# Patient Record
Sex: Female | Born: 1955 | Race: White | Hispanic: No | Marital: Married | Smoking: Never smoker
Health system: Southern US, Community
[De-identification: ages and names within clinical notes are randomized; demographics above are authoritative.]

## PROBLEM LIST (undated history)

## (undated) DIAGNOSIS — E7800 Pure hypercholesterolemia, unspecified: Secondary | ICD-10-CM

## (undated) DIAGNOSIS — F419 Anxiety disorder, unspecified: Secondary | ICD-10-CM

## (undated) DIAGNOSIS — F329 Major depressive disorder, single episode, unspecified: Secondary | ICD-10-CM

## (undated) DIAGNOSIS — F32A Depression, unspecified: Secondary | ICD-10-CM

## (undated) DIAGNOSIS — I10 Essential (primary) hypertension: Secondary | ICD-10-CM

## (undated) DIAGNOSIS — E785 Hyperlipidemia, unspecified: Secondary | ICD-10-CM

## (undated) HISTORY — PX: COLONOSCOPY: SHX174

## (undated) HISTORY — PX: HERNIA REPAIR: SHX51

---

## 2010-05-17 ENCOUNTER — Other Ambulatory Visit: Admission: RE | Admit: 2010-05-17 | Discharge: 2010-05-17 | Payer: Self-pay | Admitting: Family Medicine

## 2011-06-29 ENCOUNTER — Other Ambulatory Visit: Payer: Self-pay | Admitting: Family Medicine

## 2011-06-29 DIAGNOSIS — Z1231 Encounter for screening mammogram for malignant neoplasm of breast: Secondary | ICD-10-CM

## 2011-07-25 ENCOUNTER — Ambulatory Visit
Admission: RE | Admit: 2011-07-25 | Discharge: 2011-07-25 | Disposition: A | Discharge status: Home / Self Care | Payer: BC Managed Care – HMO | Source: Ambulatory Visit | Attending: Family Medicine | Admitting: Family Medicine

## 2011-07-25 DIAGNOSIS — Z1231 Encounter for screening mammogram for malignant neoplasm of breast: Secondary | ICD-10-CM

## 2012-05-28 ENCOUNTER — Other Ambulatory Visit (HOSPITAL_COMMUNITY)
Admission: RE | Admit: 2012-05-28 | Discharge: 2012-05-28 | Disposition: A | Discharge status: Home / Self Care | Payer: BC Managed Care – HMO | Source: Ambulatory Visit | Attending: Family Medicine | Admitting: Family Medicine

## 2012-05-28 DIAGNOSIS — Z124 Encounter for screening for malignant neoplasm of cervix: Secondary | ICD-10-CM | POA: Insufficient documentation

## 2015-02-24 ENCOUNTER — Other Ambulatory Visit (HOSPITAL_COMMUNITY)
Admission: RE | Admit: 2015-02-24 | Discharge: 2015-02-24 | Disposition: A | Discharge status: Home / Self Care | Payer: 59 | Source: Ambulatory Visit | Attending: Family Medicine | Admitting: Family Medicine

## 2015-02-24 ENCOUNTER — Other Ambulatory Visit: Payer: Self-pay | Admitting: Family Medicine

## 2015-02-24 DIAGNOSIS — Z01419 Encounter for gynecological examination (general) (routine) without abnormal findings: Secondary | ICD-10-CM | POA: Insufficient documentation

## 2015-02-26 LAB — CYTOLOGY - PAP

## 2017-04-10 ENCOUNTER — Encounter (HOSPITAL_COMMUNITY): Payer: Self-pay | Admitting: Emergency Medicine

## 2017-04-10 ENCOUNTER — Ambulatory Visit (HOSPITAL_COMMUNITY)
Admission: EM | Admit: 2017-04-10 | Discharge: 2017-04-10 | Disposition: A | Discharge status: Home / Self Care | Payer: 59 | Attending: Family Medicine | Admitting: Family Medicine

## 2017-04-10 DIAGNOSIS — R1030 Lower abdominal pain, unspecified: Secondary | ICD-10-CM

## 2017-04-10 HISTORY — DX: Major depressive disorder, single episode, unspecified: F32.9

## 2017-04-10 HISTORY — DX: Essential (primary) hypertension: I10

## 2017-04-10 HISTORY — DX: Pure hypercholesterolemia, unspecified: E78.00

## 2017-04-10 HISTORY — DX: Depression, unspecified: F32.A

## 2017-04-10 LAB — POCT URINALYSIS DIP (DEVICE)
BILIRUBIN URINE: NEGATIVE
Glucose, UA: NEGATIVE mg/dL
Ketones, ur: 15 mg/dL — AB
NITRITE: NEGATIVE
PH: 6.5 (ref 5.0–8.0)
PROTEIN: NEGATIVE mg/dL
Specific Gravity, Urine: 1.01 (ref 1.005–1.030)
Urobilinogen, UA: 0.2 mg/dL (ref 0.0–1.0)

## 2017-04-10 NOTE — ED Triage Notes (Addendum)
Abdominal pain started yesterday morning.  Pain is in lower abdomen.  Pain is not going away, pain is worsening over time.  Denies urinary issues.  Last BM was this morning-considered normal.  Since then has had several loose stools, last one was approximately 4:00 pm

## 2017-04-10 NOTE — ED Provider Notes (Signed)
  [facility]   [id number] 04/10/17 Arrival Time: 1738  ASSESSMENT & PLAN:  1. Lower abdominal pain    Discussed possible causes of lower abdominal discomfort. U/A with small leukocytes. She is without specific urinary symptoms. No empiric treatment. Will send culture. She is comfortable with observation for the next 24 hours. Will proceed to the ED if any acute worsening.  Reviewed expectations re: course of current medical issues. Questions answered. Outlined signs and symptoms indicating need for more acute intervention. Patient verbalized understanding. After Visit Summary given.   SUBJECTIVE:  Sharon Beard is a 61 y.o. female who presents with complaint of lower abdominal discomfort first noticed yesterday morning. Generalized lower abdomen. Gradual onset but fairly persistent. Slightly worse with certain movements. No h/o similar. Normal appetite and PO intake. No n/v. Afebrile. No urinary symptoms. No flank or back discomfort. "Loose" bowel movement x 3 yesterday without blood. Passing gas. Ambulatory. Able to sleep through the night without discomfort.  H/O direct hernia repair L years ago. Has noticed "bulging" in the area. Has seen PCP regarding. Current symptoms not in same area.  Past Surgical History:  Procedure Laterality Date  . HERNIA REPAIR     Past Medical History:  Diagnosis Date  . Depression   . High cholesterol   . Hypertension    No FH of colon problems.  ROS: As per HPI. All other systems negative.   OBJECTIVE:  Vitals:   04/10/17 1808  BP: (!) 128/92  Pulse: 83  Resp: 18  Temp: 99.5 F (37.5 C)  TempSrc: Oral  SpO2: 97%     General appearance: alert; no distress HEENT: normocephalic; atraumatic Lungs: clear to auscultation bilaterally Abdomen: soft, generalized tenderness over lower abdomen; she questions R>L; does demonstrate rebound tenderness R>L; bowel sounds normal; no masses or organomegaly; no guarding Back: no CVA  tenderness Extremities: no cyanosis or edema; symmetrical with no gross deformities Skin: warm and dry Neurologic: normal gait Psychological:  alert and cooperative; normal mood and affect  Results for orders placed or performed during the hospital encounter of 04/10/17  POCT urinalysis dip (device)  Result Value Ref Range   Glucose, UA NEGATIVE NEGATIVE mg/dL   Bilirubin Urine NEGATIVE NEGATIVE   Ketones, ur 15 (A) NEGATIVE mg/dL   Specific Gravity, Urine 1.010 1.005 - 1.030   Hgb urine dipstick TRACE (A) NEGATIVE   pH 6.5 5.0 - 8.0   Protein, ur NEGATIVE NEGATIVE mg/dL   Urobilinogen, UA 0.2 0.0 - 1.0 mg/dL   Nitrite NEGATIVE NEGATIVE   Leukocytes, UA SMALL (A) NEGATIVE    Labs Reviewed  POCT URINALYSIS DIP (DEVICE) - Abnormal; Notable for the following:       Result Value   Ketones, ur 15 (*)    Hgb urine dipstick TRACE (*)    Leukocytes, UA SMALL (*)    All other components within normal limits    No Known Allergies     Mardella Layman, MD 04/10/17 [id number]

## 2017-12-11 ENCOUNTER — Other Ambulatory Visit: Payer: Self-pay | Admitting: Family Medicine

## 2017-12-11 ENCOUNTER — Other Ambulatory Visit (HOSPITAL_COMMUNITY)
Admission: RE | Admit: 2017-12-11 | Discharge: 2017-12-11 | Disposition: A | Discharge status: Home / Self Care | Payer: BLUE CROSS/BLUE SHIELD | Source: Ambulatory Visit | Attending: Family Medicine | Admitting: Family Medicine

## 2017-12-11 DIAGNOSIS — Z23 Encounter for immunization: Secondary | ICD-10-CM | POA: Diagnosis not present

## 2017-12-11 DIAGNOSIS — Z124 Encounter for screening for malignant neoplasm of cervix: Secondary | ICD-10-CM | POA: Diagnosis not present

## 2017-12-11 DIAGNOSIS — Z0000 Encounter for general adult medical examination without abnormal findings: Secondary | ICD-10-CM | POA: Diagnosis not present

## 2017-12-11 DIAGNOSIS — E7800 Pure hypercholesterolemia, unspecified: Secondary | ICD-10-CM | POA: Diagnosis not present

## 2017-12-11 DIAGNOSIS — Z1159 Encounter for screening for other viral diseases: Secondary | ICD-10-CM | POA: Diagnosis not present

## 2017-12-13 LAB — CYTOLOGY - PAP: DIAGNOSIS: NEGATIVE

## 2017-12-31 DIAGNOSIS — Z01818 Encounter for other preprocedural examination: Secondary | ICD-10-CM | POA: Diagnosis not present

## 2018-06-10 DIAGNOSIS — Z23 Encounter for immunization: Secondary | ICD-10-CM | POA: Diagnosis not present

## 2018-06-13 DIAGNOSIS — J309 Allergic rhinitis, unspecified: Secondary | ICD-10-CM | POA: Diagnosis not present

## 2018-06-13 DIAGNOSIS — Z1159 Encounter for screening for other viral diseases: Secondary | ICD-10-CM | POA: Diagnosis not present

## 2018-06-13 DIAGNOSIS — E7800 Pure hypercholesterolemia, unspecified: Secondary | ICD-10-CM | POA: Diagnosis not present

## 2018-06-13 DIAGNOSIS — F411 Generalized anxiety disorder: Secondary | ICD-10-CM | POA: Diagnosis not present

## 2018-06-13 DIAGNOSIS — I10 Essential (primary) hypertension: Secondary | ICD-10-CM | POA: Diagnosis not present

## 2019-01-03 DIAGNOSIS — Z0000 Encounter for general adult medical examination without abnormal findings: Secondary | ICD-10-CM | POA: Diagnosis not present

## 2019-01-07 DIAGNOSIS — E7800 Pure hypercholesterolemia, unspecified: Secondary | ICD-10-CM | POA: Diagnosis not present

## 2019-03-12 DIAGNOSIS — Z20828 Contact with and (suspected) exposure to other viral communicable diseases: Secondary | ICD-10-CM | POA: Diagnosis not present

## 2019-04-17 ENCOUNTER — Other Ambulatory Visit: Payer: Self-pay | Admitting: Family Medicine

## 2019-04-17 DIAGNOSIS — Z1231 Encounter for screening mammogram for malignant neoplasm of breast: Secondary | ICD-10-CM

## 2019-05-21 DIAGNOSIS — Z23 Encounter for immunization: Secondary | ICD-10-CM | POA: Diagnosis not present

## 2019-06-04 ENCOUNTER — Ambulatory Visit
Admission: RE | Admit: 2019-06-04 | Discharge: 2019-06-04 | Disposition: A | Discharge status: Home / Self Care | Payer: BLUE CROSS/BLUE SHIELD | Source: Ambulatory Visit | Attending: Family Medicine | Admitting: Family Medicine

## 2019-06-04 ENCOUNTER — Other Ambulatory Visit: Payer: Self-pay

## 2019-06-04 DIAGNOSIS — Z1231 Encounter for screening mammogram for malignant neoplasm of breast: Secondary | ICD-10-CM | POA: Diagnosis not present

## 2019-06-10 DIAGNOSIS — Z20828 Contact with and (suspected) exposure to other viral communicable diseases: Secondary | ICD-10-CM | POA: Diagnosis not present

## 2019-07-24 DIAGNOSIS — I10 Essential (primary) hypertension: Secondary | ICD-10-CM | POA: Diagnosis not present

## 2019-07-24 DIAGNOSIS — J309 Allergic rhinitis, unspecified: Secondary | ICD-10-CM | POA: Diagnosis not present

## 2019-07-24 DIAGNOSIS — F411 Generalized anxiety disorder: Secondary | ICD-10-CM | POA: Diagnosis not present

## 2019-07-24 DIAGNOSIS — E7800 Pure hypercholesterolemia, unspecified: Secondary | ICD-10-CM | POA: Diagnosis not present

## 2019-08-01 DIAGNOSIS — I10 Essential (primary) hypertension: Secondary | ICD-10-CM | POA: Diagnosis not present

## 2019-10-13 ENCOUNTER — Encounter (INDEPENDENT_AMBULATORY_CARE_PROVIDER_SITE_OTHER): Payer: Self-pay | Admitting: Otolaryngology

## 2019-10-13 ENCOUNTER — Other Ambulatory Visit: Payer: Self-pay

## 2019-10-13 ENCOUNTER — Ambulatory Visit (INDEPENDENT_AMBULATORY_CARE_PROVIDER_SITE_OTHER): Payer: BC Managed Care – PPO | Admitting: Otolaryngology

## 2019-10-13 VITALS — Temp 97.9°F

## 2019-10-13 DIAGNOSIS — H6123 Impacted cerumen, bilateral: Secondary | ICD-10-CM | POA: Diagnosis not present

## 2019-10-13 DIAGNOSIS — H903 Sensorineural hearing loss, bilateral: Secondary | ICD-10-CM | POA: Diagnosis not present

## 2019-10-13 NOTE — Progress Notes (Signed)
HPI: Sharon Beard is a 64 y.o. female who presents is referred by her audiologist for evaluation of cerumen buildup in both ear canals.  She just recently got hearing aids and was referred here to have her ears cleaned.. She has used Debrox at home.  Past Medical History:  Diagnosis Date  . Depression   . High cholesterol   . Hypertension    Past Surgical History:  Procedure Laterality Date  . HERNIA REPAIR     Social History   Socioeconomic History  . Marital status: Married    Spouse name: Not on file  . Number of children: Not on file  . Years of education: Not on file  . Highest education level: Not on file  Occupational History  . Not on file  Tobacco Use  . Smoking status: Never Smoker  . Smokeless tobacco: Never Used  Substance and Sexual Activity  . Alcohol use: Yes  . Drug use: No  . Sexual activity: Not on file  Other Topics Concern  . Not on file  Social History Narrative  . Not on file   Social Determinants of Health   Financial Resource Strain:   . Difficulty of Paying Living Expenses: Not on file  Food Insecurity:   . Worried About Programme researcher, broadcasting/film/video in the Last Year: Not on file  . Ran Out of Food in the Last Year: Not on file  Transportation Needs:   . Lack of Transportation (Medical): Not on file  . Lack of Transportation (Non-Medical): Not on file  Physical Activity:   . Days of Exercise per Week: Not on file  . Minutes of Exercise per Session: Not on file  Stress:   . Feeling of Stress : Not on file  Social Connections:   . Frequency of Communication with Friends and Family: Not on file  . Frequency of Social Gatherings with Friends and Family: Not on file  . Attends Religious Services: Not on file  . Active Member of Clubs or Organizations: Not on file  . Attends Banker Meetings: Not on file  . Marital Status: Not on file   Family History  Problem Relation Age of Onset  . Breast cancer Neg Hx    No Known  Allergies Prior to Admission medications   Medication Sig Start Date End Date Taking? Authorizing Provider  ENALAPRIL-HYDROCHLOROTHIAZIDE PO Take by mouth.   Yes [provider]  Multiple Vitamin (MULTIVITAMIN) tablet Take 1 tablet by mouth daily.   Yes [provider]  PAROXETINE HCL PO Take by mouth.   Yes [provider]  SIMVASTATIN PO Take by mouth.   Yes [provider]     Positive ROS: Otherwise negative  All other systems have been reviewed and were otherwise negative with the exception of those mentioned in the HPI and as above.  Physical Exam: Constitutional: Alert, well-appearing, no acute distress Ears: External ears without lesions or tenderness.  She had a large amount of wax in both ear canals saliva was adjacent to the TMs bilaterally.  This was cleaned with hydroperoxide and suction.  TMs were clear bilaterally. Nasal: External nose without lesions. Septum midline. Clear nasal passages Oral: Lips and gums without lesions. Tongue and palate mucosa without lesions. Posterior oropharynx clear. Neck: No palpable adenopathy or masses Respiratory: Breathing comfortably  Skin: No facial/neck lesions or rash noted.  Cerumen impaction removal  Date/Time: 10/13/2019 5:50 PM Performed by: Drema Halon, MD Authorized by: Drema Halon, MD  Consent:    Consent obtained:  Verbal   Consent given by:  Patient   Risks discussed:  Pain and bleeding Procedure details:    Location:  L ear and R ear   Procedure type: suction   Post-procedure details:    Inspection:  TM intact and canal normal   Hearing quality:  Improved   Patient tolerance of procedure:  Tolerated well, no immediate complications Comments:     TMs are clear bilaterally.    Assessment: Bilateral cerumen impactions. Bilateral sensorineural hearing loss.  Of note the right hearing aid is not working presently in the office  Plan: She will follow-up with  her audiologist and follow-up here as needed.   Radene Journey, MD   CC:

## 2019-10-26 ENCOUNTER — Ambulatory Visit: Payer: BC Managed Care – PPO | Attending: Internal Medicine

## 2019-10-26 DIAGNOSIS — Z23 Encounter for immunization: Secondary | ICD-10-CM | POA: Insufficient documentation

## 2019-10-26 NOTE — Progress Notes (Signed)
   Covid-19 Vaccination Clinic  Name:  Sharon Beard    MRN: [id number] DOB: [birth year]  10/26/2019  Sharon Beard was observed post Covid-19 immunization for 15 minutes without incident. She was provided with Vaccine Information Sheet and instruction to access the V-Safe system.   Sharon Beard was instructed to call 911 with any severe reactions post vaccine: Marland Kitchen Difficulty breathing  . Swelling of face and throat  . A fast heartbeat  . A bad rash all over body  . Dizziness and weakness   Immunizations Administered    Name Date Dose VIS Date Route   Pfizer COVID-19 Vaccine 10/26/2019  5:54 PM 0.3 mL 08/01/2019 Intramuscular   Manufacturer: [employer], [employer]   Lot: [id number]   NDC: [address]-2

## 2019-11-26 ENCOUNTER — Ambulatory Visit: Payer: BC Managed Care – PPO | Attending: Internal Medicine

## 2019-11-26 DIAGNOSIS — Z23 Encounter for immunization: Secondary | ICD-10-CM

## 2019-11-26 NOTE — Progress Notes (Signed)
   Covid-19 Vaccination Clinic  Name:  Takeisha Cianci    MRN: [id number] DOB: [birth year]  11/26/2019  Ms. Sherwood was observed post Covid-19 immunization for 15 minutes without incident. She was provided with Vaccine Information Sheet and instruction to access the V-Safe system.   Ms. Piechowski was instructed to call 911 with any severe reactions post vaccine: Marland Kitchen Difficulty breathing  . Swelling of face and throat  . A fast heartbeat  . A bad rash all over body  . Dizziness and weakness   Immunizations Administered    Name Date Dose VIS Date Route   Pfizer COVID-19 Vaccine 11/26/2019  9:24 AM 0.3 mL 08/01/2019 Intramuscular   Manufacturer: [employer], [employer]   Lot: [id number]   NDC: [address]-3

## 2020-04-13 DIAGNOSIS — F1010 Alcohol abuse, uncomplicated: Secondary | ICD-10-CM | POA: Diagnosis not present

## 2020-04-13 DIAGNOSIS — Z9189 Other specified personal risk factors, not elsewhere classified: Secondary | ICD-10-CM | POA: Diagnosis not present

## 2020-04-13 DIAGNOSIS — Z114 Encounter for screening for human immunodeficiency virus [HIV]: Secondary | ICD-10-CM | POA: Diagnosis not present

## 2020-04-13 DIAGNOSIS — Z1159 Encounter for screening for other viral diseases: Secondary | ICD-10-CM | POA: Diagnosis not present

## 2020-06-01 DIAGNOSIS — F419 Anxiety disorder, unspecified: Secondary | ICD-10-CM | POA: Diagnosis not present

## 2020-06-01 DIAGNOSIS — F1020 Alcohol dependence, uncomplicated: Secondary | ICD-10-CM | POA: Diagnosis not present

## 2020-06-10 DIAGNOSIS — Z23 Encounter for immunization: Secondary | ICD-10-CM | POA: Diagnosis not present

## 2020-06-15 DIAGNOSIS — F419 Anxiety disorder, unspecified: Secondary | ICD-10-CM | POA: Diagnosis not present

## 2020-06-15 DIAGNOSIS — F1020 Alcohol dependence, uncomplicated: Secondary | ICD-10-CM | POA: Diagnosis not present

## 2020-06-29 DIAGNOSIS — F1020 Alcohol dependence, uncomplicated: Secondary | ICD-10-CM | POA: Diagnosis not present

## 2020-06-29 DIAGNOSIS — I10 Essential (primary) hypertension: Secondary | ICD-10-CM | POA: Diagnosis not present

## 2020-07-27 DIAGNOSIS — F1020 Alcohol dependence, uncomplicated: Secondary | ICD-10-CM | POA: Diagnosis not present

## 2020-07-30 DIAGNOSIS — R059 Cough, unspecified: Secondary | ICD-10-CM | POA: Diagnosis not present

## 2020-07-30 DIAGNOSIS — J209 Acute bronchitis, unspecified: Secondary | ICD-10-CM | POA: Diagnosis not present

## 2020-09-30 DIAGNOSIS — I10 Essential (primary) hypertension: Secondary | ICD-10-CM | POA: Diagnosis not present

## 2020-09-30 DIAGNOSIS — J309 Allergic rhinitis, unspecified: Secondary | ICD-10-CM | POA: Diagnosis not present

## 2020-09-30 DIAGNOSIS — E7800 Pure hypercholesterolemia, unspecified: Secondary | ICD-10-CM | POA: Diagnosis not present

## 2020-09-30 DIAGNOSIS — F411 Generalized anxiety disorder: Secondary | ICD-10-CM | POA: Diagnosis not present

## 2020-09-30 DIAGNOSIS — Z23 Encounter for immunization: Secondary | ICD-10-CM | POA: Diagnosis not present

## 2020-09-30 DIAGNOSIS — Z1211 Encounter for screening for malignant neoplasm of colon: Secondary | ICD-10-CM | POA: Diagnosis not present

## 2020-11-29 DIAGNOSIS — F410 Panic disorder [episodic paroxysmal anxiety] without agoraphobia: Secondary | ICD-10-CM | POA: Diagnosis not present

## 2020-11-29 DIAGNOSIS — F1020 Alcohol dependence, uncomplicated: Secondary | ICD-10-CM | POA: Diagnosis not present

## 2020-12-13 DIAGNOSIS — F409 Phobic anxiety disorder, unspecified: Secondary | ICD-10-CM | POA: Diagnosis not present

## 2020-12-13 DIAGNOSIS — F1020 Alcohol dependence, uncomplicated: Secondary | ICD-10-CM | POA: Diagnosis not present

## 2020-12-14 IMAGING — MG DIGITAL SCREENING BILAT W/ TOMO W/ CAD
6 of 10 series · 6 of 30 positions shown · non-contrast
Comparison: Previous exam(s).

CLINICAL DATA: Screening.

EXAM:
DIGITAL SCREENING BILATERAL MAMMOGRAM WITH TOMO AND CAD

[L CC synth-2D (1 of 2)]
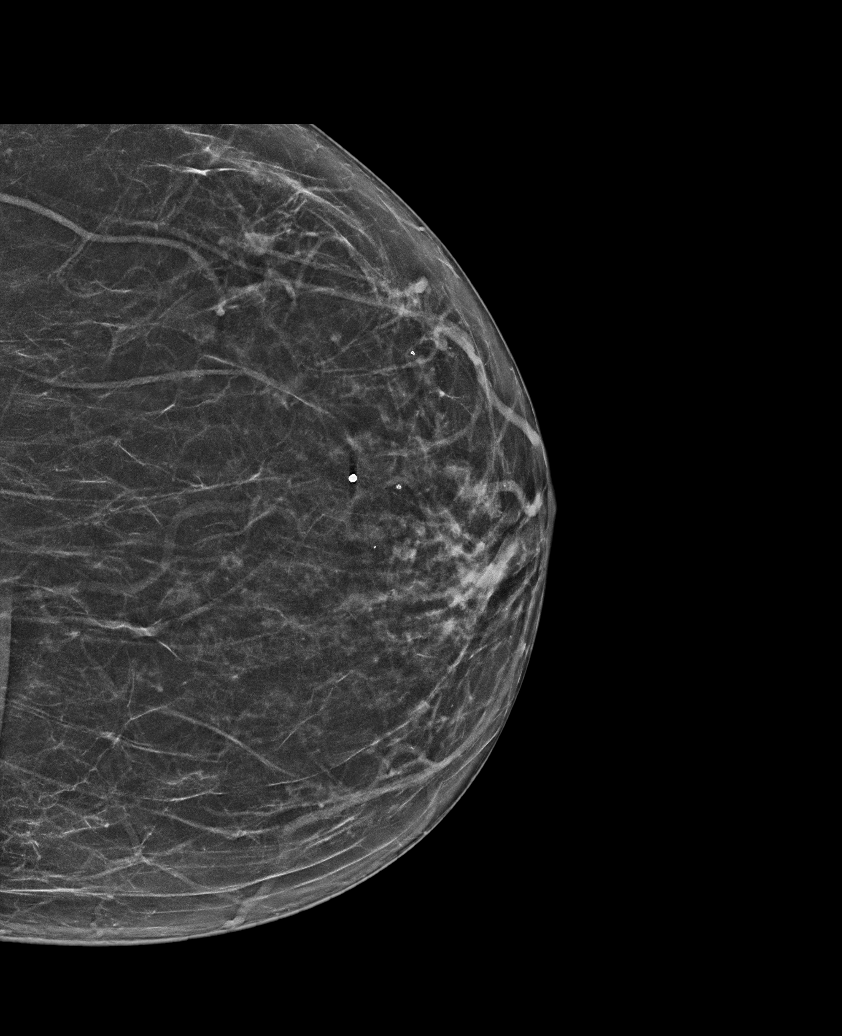

[L MLO synth-2D]
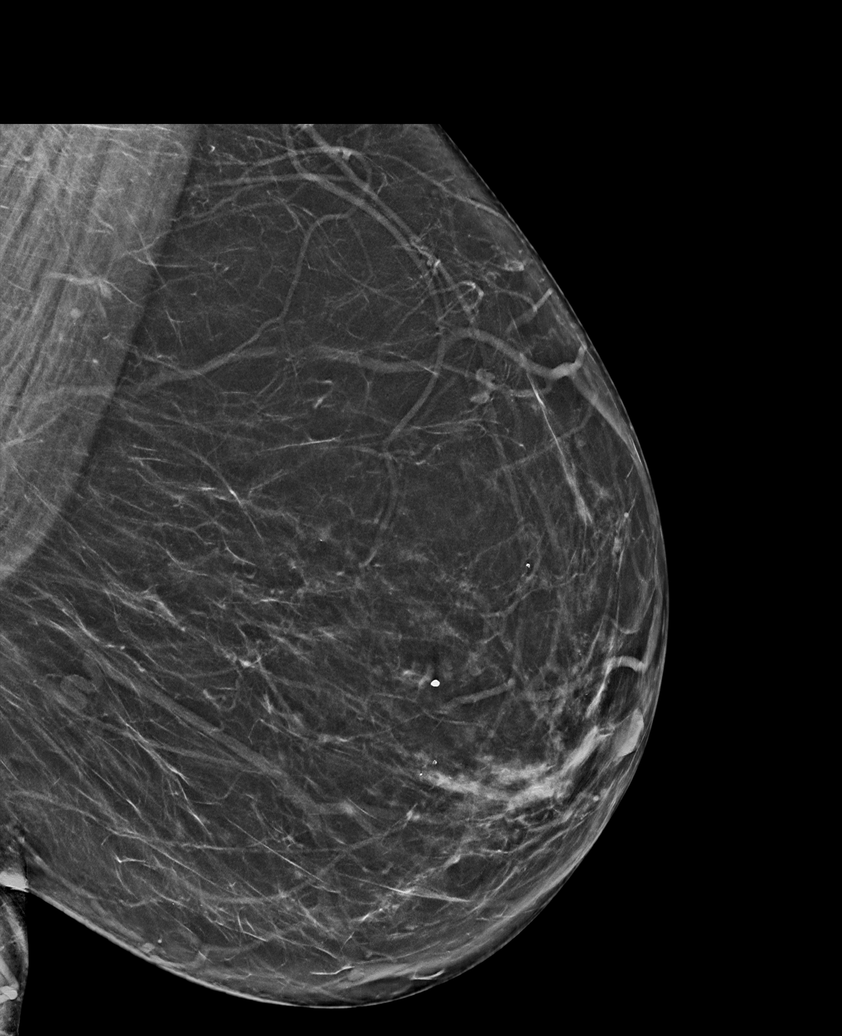

[L CC synth-2D (2 of 2)]
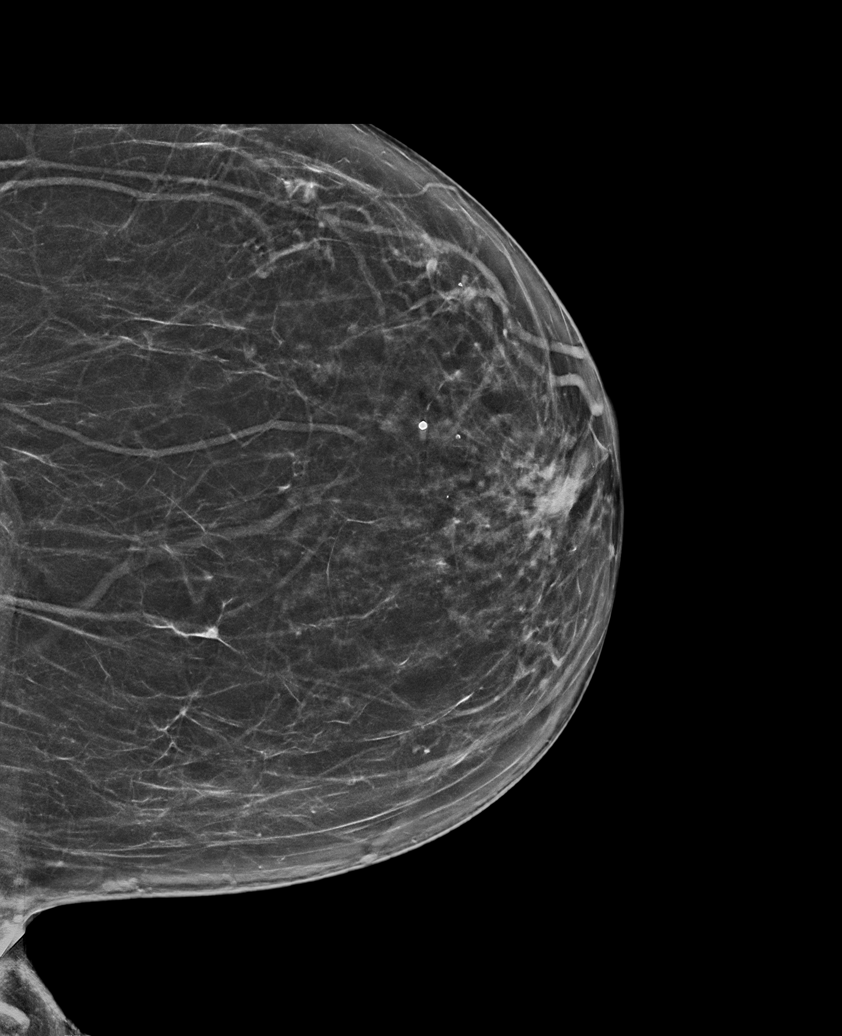

[R CC synth-2D]
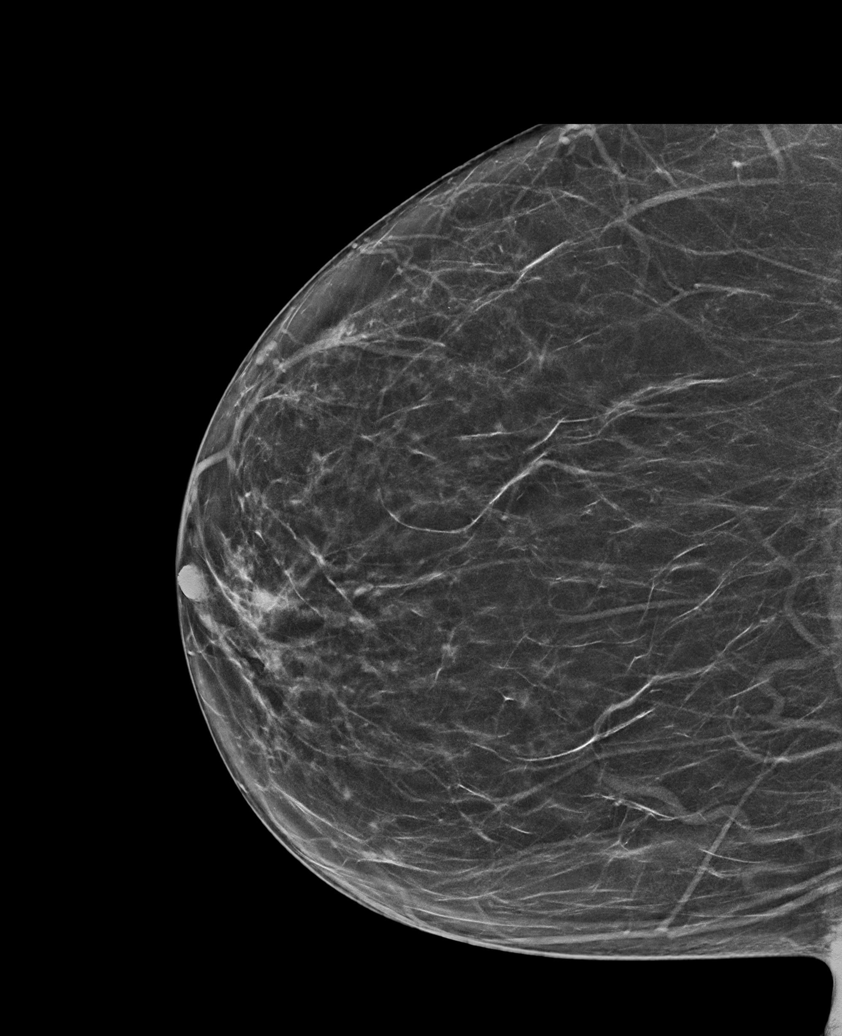

[R MLO synth-2D]
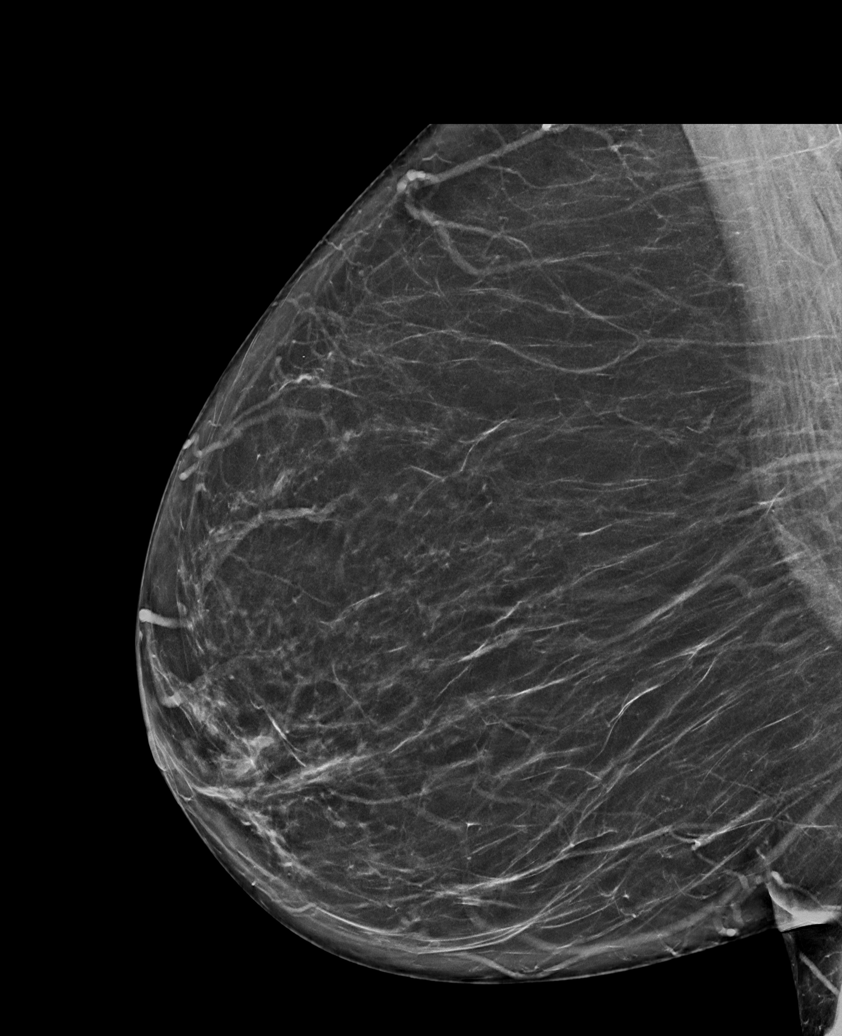

[R MLO tomo · tomo slice 41/80.0]
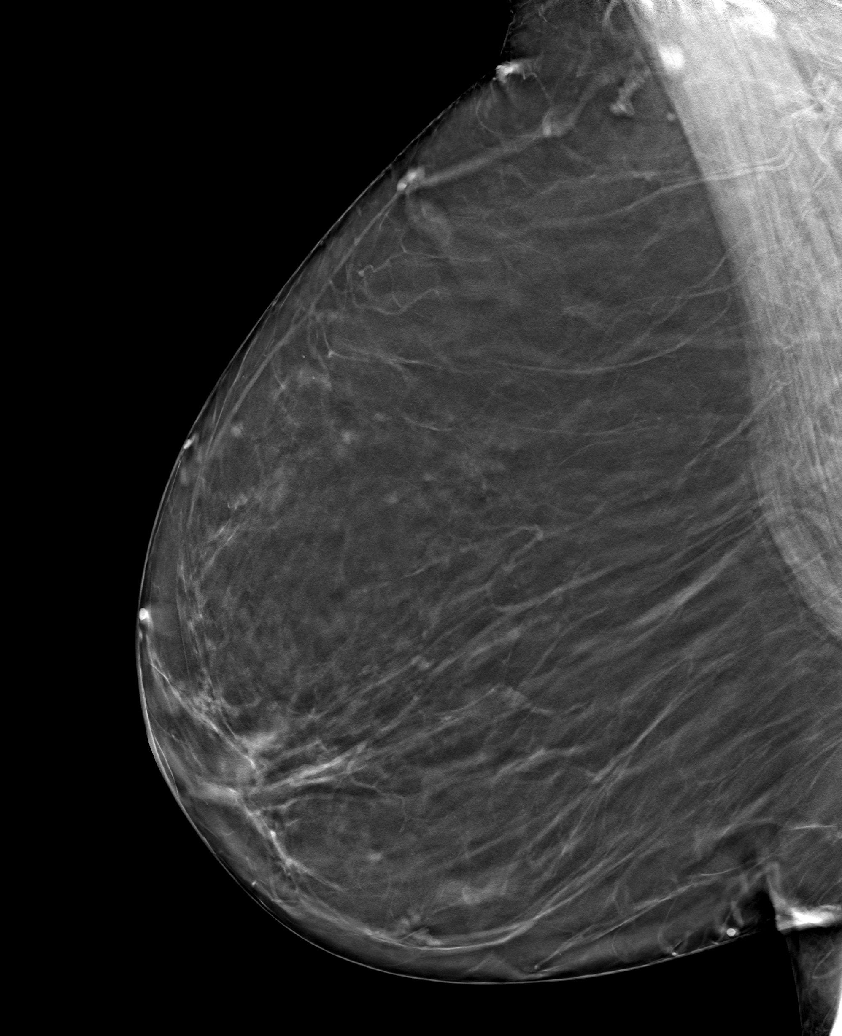

[6 of 30 positions shown; findings below may reference images not displayed]

ACR Breast Density Category b: There are scattered areas of
fibroglandular density.
FINDINGS: There are no findings suspicious for malignancy. Images were
processed with CAD.
IMPRESSION: No mammographic evidence of malignancy. A result letter of this
screening mammogram will be mailed directly to the patient.

RECOMMENDATION:
Screening mammogram in one year. (Code:[id number])

BI-RADS CATEGORY  1: Negative.

## 2020-12-27 DIAGNOSIS — F409 Phobic anxiety disorder, unspecified: Secondary | ICD-10-CM | POA: Diagnosis not present

## 2020-12-27 DIAGNOSIS — F1020 Alcohol dependence, uncomplicated: Secondary | ICD-10-CM | POA: Diagnosis not present

## 2021-01-26 DIAGNOSIS — F1020 Alcohol dependence, uncomplicated: Secondary | ICD-10-CM | POA: Diagnosis not present

## 2021-01-26 DIAGNOSIS — F410 Panic disorder [episodic paroxysmal anxiety] without agoraphobia: Secondary | ICD-10-CM | POA: Diagnosis not present

## 2021-03-02 DIAGNOSIS — F409 Phobic anxiety disorder, unspecified: Secondary | ICD-10-CM | POA: Diagnosis not present

## 2021-03-02 DIAGNOSIS — F1020 Alcohol dependence, uncomplicated: Secondary | ICD-10-CM | POA: Diagnosis not present

## 2021-03-25 DIAGNOSIS — E7800 Pure hypercholesterolemia, unspecified: Secondary | ICD-10-CM | POA: Diagnosis not present

## 2021-03-25 DIAGNOSIS — Z0000 Encounter for general adult medical examination without abnormal findings: Secondary | ICD-10-CM | POA: Diagnosis not present

## 2021-03-25 DIAGNOSIS — F411 Generalized anxiety disorder: Secondary | ICD-10-CM | POA: Diagnosis not present

## 2021-03-25 DIAGNOSIS — M25559 Pain in unspecified hip: Secondary | ICD-10-CM | POA: Diagnosis not present

## 2021-03-25 DIAGNOSIS — Z124 Encounter for screening for malignant neoplasm of cervix: Secondary | ICD-10-CM | POA: Diagnosis not present

## 2021-03-25 DIAGNOSIS — I10 Essential (primary) hypertension: Secondary | ICD-10-CM | POA: Diagnosis not present

## 2021-03-25 DIAGNOSIS — Z23 Encounter for immunization: Secondary | ICD-10-CM | POA: Diagnosis not present

## 2021-03-30 ENCOUNTER — Other Ambulatory Visit: Payer: Self-pay | Admitting: Family Medicine

## 2021-03-30 DIAGNOSIS — E2839 Other primary ovarian failure: Secondary | ICD-10-CM

## 2021-04-04 DIAGNOSIS — F1020 Alcohol dependence, uncomplicated: Secondary | ICD-10-CM | POA: Diagnosis not present

## 2021-04-04 DIAGNOSIS — F409 Phobic anxiety disorder, unspecified: Secondary | ICD-10-CM | POA: Diagnosis not present

## 2021-04-05 ENCOUNTER — Other Ambulatory Visit: Payer: Self-pay | Admitting: Family Medicine

## 2021-04-05 DIAGNOSIS — Z1231 Encounter for screening mammogram for malignant neoplasm of breast: Secondary | ICD-10-CM

## 2021-04-14 ENCOUNTER — Ambulatory Visit
Admission: RE | Admit: 2021-04-14 | Discharge: 2021-04-14 | Disposition: A | Discharge status: Home / Self Care | Payer: BC Managed Care – PPO | Source: Ambulatory Visit | Attending: Family Medicine | Admitting: Family Medicine

## 2021-04-14 ENCOUNTER — Other Ambulatory Visit: Payer: Self-pay

## 2021-04-14 DIAGNOSIS — Z1231 Encounter for screening mammogram for malignant neoplasm of breast: Secondary | ICD-10-CM | POA: Diagnosis not present

## 2021-05-09 DIAGNOSIS — F1020 Alcohol dependence, uncomplicated: Secondary | ICD-10-CM | POA: Diagnosis not present

## 2021-05-09 DIAGNOSIS — F409 Phobic anxiety disorder, unspecified: Secondary | ICD-10-CM | POA: Diagnosis not present

## 2021-06-01 DIAGNOSIS — F1020 Alcohol dependence, uncomplicated: Secondary | ICD-10-CM | POA: Diagnosis not present

## 2021-06-06 DIAGNOSIS — F1020 Alcohol dependence, uncomplicated: Secondary | ICD-10-CM | POA: Diagnosis not present

## 2021-06-06 DIAGNOSIS — F409 Phobic anxiety disorder, unspecified: Secondary | ICD-10-CM | POA: Diagnosis not present

## 2021-06-30 DIAGNOSIS — F1020 Alcohol dependence, uncomplicated: Secondary | ICD-10-CM | POA: Diagnosis not present

## 2021-07-07 DIAGNOSIS — F1020 Alcohol dependence, uncomplicated: Secondary | ICD-10-CM | POA: Diagnosis not present

## 2021-07-07 DIAGNOSIS — F409 Phobic anxiety disorder, unspecified: Secondary | ICD-10-CM | POA: Diagnosis not present

## 2021-07-26 DIAGNOSIS — F1020 Alcohol dependence, uncomplicated: Secondary | ICD-10-CM | POA: Diagnosis not present

## 2021-08-24 DIAGNOSIS — F409 Phobic anxiety disorder, unspecified: Secondary | ICD-10-CM | POA: Diagnosis not present

## 2021-08-24 DIAGNOSIS — F1020 Alcohol dependence, uncomplicated: Secondary | ICD-10-CM | POA: Diagnosis not present

## 2021-09-20 DIAGNOSIS — M25511 Pain in right shoulder: Secondary | ICD-10-CM | POA: Diagnosis not present

## 2021-09-20 DIAGNOSIS — G5600 Carpal tunnel syndrome, unspecified upper limb: Secondary | ICD-10-CM | POA: Diagnosis not present

## 2021-09-23 ENCOUNTER — Ambulatory Visit
Admission: RE | Admit: 2021-09-23 | Discharge: 2021-09-23 | Disposition: A | Discharge status: Home / Self Care | Payer: BC Managed Care – PPO | Source: Ambulatory Visit | Attending: Family Medicine | Admitting: Family Medicine

## 2021-09-23 DIAGNOSIS — M8589 Other specified disorders of bone density and structure, multiple sites: Secondary | ICD-10-CM | POA: Diagnosis not present

## 2021-09-23 DIAGNOSIS — Z780 Asymptomatic menopausal state: Secondary | ICD-10-CM | POA: Diagnosis not present

## 2021-09-23 DIAGNOSIS — E2839 Other primary ovarian failure: Secondary | ICD-10-CM

## 2021-09-28 DIAGNOSIS — F411 Generalized anxiety disorder: Secondary | ICD-10-CM | POA: Diagnosis not present

## 2021-09-28 DIAGNOSIS — Z23 Encounter for immunization: Secondary | ICD-10-CM | POA: Diagnosis not present

## 2021-09-28 DIAGNOSIS — E7800 Pure hypercholesterolemia, unspecified: Secondary | ICD-10-CM | POA: Diagnosis not present

## 2021-09-28 DIAGNOSIS — J309 Allergic rhinitis, unspecified: Secondary | ICD-10-CM | POA: Diagnosis not present

## 2021-09-28 DIAGNOSIS — I10 Essential (primary) hypertension: Secondary | ICD-10-CM | POA: Diagnosis not present

## 2021-11-04 DIAGNOSIS — R200 Anesthesia of skin: Secondary | ICD-10-CM | POA: Diagnosis not present

## 2021-11-04 DIAGNOSIS — M1812 Unilateral primary osteoarthritis of first carpometacarpal joint, left hand: Secondary | ICD-10-CM | POA: Diagnosis not present

## 2021-11-04 DIAGNOSIS — R202 Paresthesia of skin: Secondary | ICD-10-CM | POA: Diagnosis not present

## 2021-11-08 DIAGNOSIS — R202 Paresthesia of skin: Secondary | ICD-10-CM | POA: Diagnosis not present

## 2021-11-08 DIAGNOSIS — R200 Anesthesia of skin: Secondary | ICD-10-CM | POA: Diagnosis not present

## 2021-11-30 DIAGNOSIS — G5612 Other lesions of median nerve, left upper limb: Secondary | ICD-10-CM | POA: Diagnosis not present

## 2021-12-05 ENCOUNTER — Other Ambulatory Visit: Payer: Self-pay | Admitting: Orthopedic Surgery

## 2021-12-05 DIAGNOSIS — G5602 Carpal tunnel syndrome, left upper limb: Secondary | ICD-10-CM | POA: Diagnosis not present

## 2021-12-06 ENCOUNTER — Encounter (HOSPITAL_BASED_OUTPATIENT_CLINIC_OR_DEPARTMENT_OTHER): Payer: Self-pay | Admitting: Orthopedic Surgery

## 2021-12-06 ENCOUNTER — Other Ambulatory Visit: Payer: Self-pay

## 2021-12-13 ENCOUNTER — Encounter (HOSPITAL_BASED_OUTPATIENT_CLINIC_OR_DEPARTMENT_OTHER)
Admission: RE | Admit: 2021-12-13 | Discharge: 2021-12-13 | Disposition: A | Discharge status: Home / Self Care | Payer: BC Managed Care – PPO | Source: Ambulatory Visit | Attending: Orthopedic Surgery | Admitting: Orthopedic Surgery

## 2021-12-13 DIAGNOSIS — I10 Essential (primary) hypertension: Secondary | ICD-10-CM | POA: Insufficient documentation

## 2021-12-13 DIAGNOSIS — Z79899 Other long term (current) drug therapy: Secondary | ICD-10-CM | POA: Diagnosis not present

## 2021-12-13 DIAGNOSIS — F32A Depression, unspecified: Secondary | ICD-10-CM | POA: Diagnosis not present

## 2021-12-13 DIAGNOSIS — G5602 Carpal tunnel syndrome, left upper limb: Secondary | ICD-10-CM | POA: Diagnosis not present

## 2021-12-13 DIAGNOSIS — M65832 Other synovitis and tenosynovitis, left forearm: Secondary | ICD-10-CM | POA: Diagnosis not present

## 2021-12-13 DIAGNOSIS — F419 Anxiety disorder, unspecified: Secondary | ICD-10-CM | POA: Diagnosis not present

## 2021-12-13 DIAGNOSIS — Z01818 Encounter for other preprocedural examination: Secondary | ICD-10-CM | POA: Insufficient documentation

## 2021-12-13 LAB — BASIC METABOLIC PANEL
Anion gap: 8 (ref 5–15)
BUN: 16 mg/dL (ref 8–23)
CO2: 25 mmol/L (ref 22–32)
Calcium: 9.3 mg/dL (ref 8.9–10.3)
Chloride: 106 mmol/L (ref 98–111)
Creatinine, Ser: 0.82 mg/dL (ref 0.44–1.00)
GFR, Estimated: >60 mL/min (ref >60)
Glucose, Bld: 87 mg/dL (ref 70–99)
Potassium: 5.1 mmol/L (ref 3.5–5.1)
Sodium: 139 mmol/L (ref 135–145)

## 2021-12-13 NOTE — Progress Notes (Signed)

## 2021-12-16 ENCOUNTER — Encounter (HOSPITAL_BASED_OUTPATIENT_CLINIC_OR_DEPARTMENT_OTHER)
Admission: RE | Disposition: A | Discharge status: Home / Self Care | Payer: Self-pay | Source: Home / Self Care | Attending: Orthopedic Surgery

## 2021-12-16 ENCOUNTER — Ambulatory Visit (HOSPITAL_BASED_OUTPATIENT_CLINIC_OR_DEPARTMENT_OTHER): Payer: BC Managed Care – PPO | Admitting: Anesthesiology

## 2021-12-16 ENCOUNTER — Ambulatory Visit (HOSPITAL_BASED_OUTPATIENT_CLINIC_OR_DEPARTMENT_OTHER)
Admission: RE | Admit: 2021-12-16 | Discharge: 2021-12-16 | Disposition: A | Discharge status: Home / Self Care | Payer: BC Managed Care – PPO | Attending: Orthopedic Surgery | Admitting: Orthopedic Surgery

## 2021-12-16 ENCOUNTER — Encounter (HOSPITAL_BASED_OUTPATIENT_CLINIC_OR_DEPARTMENT_OTHER): Payer: Self-pay | Admitting: Orthopedic Surgery

## 2021-12-16 ENCOUNTER — Other Ambulatory Visit: Payer: Self-pay

## 2021-12-16 DIAGNOSIS — F419 Anxiety disorder, unspecified: Secondary | ICD-10-CM | POA: Diagnosis not present

## 2021-12-16 DIAGNOSIS — I10 Essential (primary) hypertension: Secondary | ICD-10-CM | POA: Insufficient documentation

## 2021-12-16 DIAGNOSIS — M67832 Other specified disorders of synovium, left wrist: Secondary | ICD-10-CM | POA: Diagnosis not present

## 2021-12-16 DIAGNOSIS — M6580 Other synovitis and tenosynovitis, unspecified site: Secondary | ICD-10-CM | POA: Diagnosis not present

## 2021-12-16 DIAGNOSIS — Z79899 Other long term (current) drug therapy: Secondary | ICD-10-CM | POA: Diagnosis not present

## 2021-12-16 DIAGNOSIS — M65832 Other synovitis and tenosynovitis, left forearm: Secondary | ICD-10-CM | POA: Diagnosis not present

## 2021-12-16 DIAGNOSIS — G5602 Carpal tunnel syndrome, left upper limb: Secondary | ICD-10-CM | POA: Diagnosis not present

## 2021-12-16 DIAGNOSIS — F32A Depression, unspecified: Secondary | ICD-10-CM | POA: Diagnosis not present

## 2021-12-16 DIAGNOSIS — M659 Synovitis and tenosynovitis, unspecified: Secondary | ICD-10-CM | POA: Diagnosis not present

## 2021-12-16 HISTORY — PX: CARPAL TUNNEL RELEASE: SHX101

## 2021-12-16 HISTORY — DX: Anxiety disorder, unspecified: F41.9

## 2021-12-16 HISTORY — DX: Hyperlipidemia, unspecified: E78.5

## 2021-12-16 HISTORY — PX: TENOSYNOVECTOMY: SHX6110

## 2021-12-16 SURGERY — CARPAL TUNNEL RELEASE
Anesthesia: Regional | Site: Wrist | Laterality: Left

## 2021-12-16 MED ORDER — PROPOFOL 10 MG/ML IV BOLUS
INTRAVENOUS | Status: AC
  Filled 2021-12-16: qty 20

## 2021-12-16 MED ORDER — MIDAZOLAM HCL 2 MG/2ML IJ SOLN
INTRAMUSCULAR | Status: AC
  Filled 2021-12-16: qty 2

## 2021-12-16 MED ORDER — LACTATED RINGERS IV SOLN: INTRAVENOUS | Status: DC

## 2021-12-16 MED ORDER — LIDOCAINE 2% (20 MG/ML) 5 ML SYRINGE
INTRAMUSCULAR | Status: AC
  Filled 2021-12-16: qty 5

## 2021-12-16 MED ORDER — LIDOCAINE 2% (20 MG/ML) 5 ML SYRINGE
INTRAMUSCULAR | Status: DC | PRN
  Administered 2021-12-16: 20 mg via INTRAVENOUS

## 2021-12-16 MED ORDER — OXYCODONE HCL 5 MG PO TABS: 5.0000 mg | ORAL_TABLET | Freq: Once | ORAL | Status: DC | PRN

## 2021-12-16 MED ORDER — MIDAZOLAM HCL 5 MG/5ML IJ SOLN
INTRAMUSCULAR | Status: DC | PRN
  Administered 2021-12-16: 2 mg via INTRAVENOUS

## 2021-12-16 MED ORDER — CEFAZOLIN SODIUM-DEXTROSE 2-4 GM/100ML-% IV SOLN
INTRAVENOUS | Status: AC
  Filled 2021-12-16: qty 100

## 2021-12-16 MED ORDER — ACETAMINOPHEN 10 MG/ML IV SOLN
1000.0000 mg | Freq: Once | INTRAVENOUS | Status: DC | PRN
  Administered 2021-12-16: 1000 mg via INTRAVENOUS

## 2021-12-16 MED ORDER — ACETAMINOPHEN 160 MG/5ML PO SOLN: 325.0000 mg | ORAL | Status: DC | PRN

## 2021-12-16 MED ORDER — OXYCODONE HCL 5 MG/5ML PO SOLN: 5.0000 mg | Freq: Once | ORAL | Status: DC | PRN

## 2021-12-16 MED ORDER — ACETAMINOPHEN 325 MG PO TABS: 325.0000 mg | ORAL_TABLET | ORAL | Status: DC | PRN

## 2021-12-16 MED ORDER — ACETAMINOPHEN 10 MG/ML IV SOLN
INTRAVENOUS | Status: AC
  Filled 2021-12-16: qty 100

## 2021-12-16 MED ORDER — PROPOFOL 500 MG/50ML IV EMUL
INTRAVENOUS | Status: DC | PRN
  Administered 2021-12-16: 200 ug/kg/min via INTRAVENOUS

## 2021-12-16 MED ORDER — ONDANSETRON HCL 4 MG/2ML IJ SOLN
INTRAMUSCULAR | Status: DC | PRN
  Administered 2021-12-16: 4 mg via INTRAVENOUS

## 2021-12-16 MED ORDER — CEFAZOLIN SODIUM-DEXTROSE 2-4 GM/100ML-% IV SOLN
2.0000 g | INTRAVENOUS | Status: AC
  Administered 2021-12-16: 2 g via INTRAVENOUS

## 2021-12-16 MED ORDER — FENTANYL CITRATE (PF) 100 MCG/2ML IJ SOLN
INTRAMUSCULAR | Status: DC | PRN
  Administered 2021-12-16 (×2): 25 ug via INTRAVENOUS
  Administered 2021-12-16: 50 ug via INTRAVENOUS

## 2021-12-16 MED ORDER — HYDROCODONE-ACETAMINOPHEN 5-325 MG PO TABS: ORAL_TABLET | ORAL | 0 refills | Status: DC

## 2021-12-16 MED ORDER — LIDOCAINE HCL (PF) 0.5 % IJ SOLN
INTRAMUSCULAR | Status: DC | PRN
  Administered 2021-12-16: 30 mL via INTRAVENOUS

## 2021-12-16 MED ORDER — BUPIVACAINE HCL (PF) 0.25 % IJ SOLN
INTRAMUSCULAR | Status: DC | PRN
  Administered 2021-12-16: 9 mL

## 2021-12-16 MED ORDER — FENTANYL CITRATE (PF) 100 MCG/2ML IJ SOLN
INTRAMUSCULAR | Status: AC
  Filled 2021-12-16: qty 2

## 2021-12-16 MED ORDER — FENTANYL CITRATE (PF) 100 MCG/2ML IJ SOLN: 25.0000 ug | INTRAMUSCULAR | Status: DC | PRN

## 2021-12-16 MED ORDER — 0.9 % SODIUM CHLORIDE (POUR BTL) OPTIME
TOPICAL | Status: DC | PRN
  Administered 2021-12-16: 120 mL

## 2021-12-16 SURGICAL SUPPLY — 40 items
APL PRP STRL LF DISP 70% ISPRP (MISCELLANEOUS) ×2
BLADE SURG 15 STRL LF DISP TIS (BLADE) ×4 IMPLANT
BLADE SURG 15 STRL SS (BLADE) ×6
BNDG CMPR 9X4 STRL LF SNTH (GAUZE/BANDAGES/DRESSINGS) ×2
BNDG ELASTIC 3X5.8 VLCR STR LF (GAUZE/BANDAGES/DRESSINGS) ×3 IMPLANT
BNDG ESMARK 4X9 LF (GAUZE/BANDAGES/DRESSINGS) ×1 IMPLANT
BNDG GAUZE ELAST 4 BULKY (GAUZE/BANDAGES/DRESSINGS) ×3 IMPLANT
CHLORAPREP W/TINT 26 (MISCELLANEOUS) ×3 IMPLANT
CORD BIPOLAR FORCEPS 12FT (ELECTRODE) ×3 IMPLANT
COVER BACK TABLE 60X90IN (DRAPES) ×3 IMPLANT
COVER MAYO STAND STRL (DRAPES) ×3 IMPLANT
CUFF TOURN SGL QUICK 18X4 (TOURNIQUET CUFF) ×2 IMPLANT
DRAPE EXTREMITY T 121X128X90 (DISPOSABLE) ×3 IMPLANT
DRAPE SURG 17X23 STRL (DRAPES) ×2 IMPLANT
DRAPE U-SHAPE 47X51 STRL (DRAPES) ×1 IMPLANT
DRSG PAD ABDOMINAL 8X10 ST (GAUZE/BANDAGES/DRESSINGS) ×3 IMPLANT
GAUZE 4X4 16PLY ~~LOC~~+RFID DBL (SPONGE) ×1 IMPLANT
GAUZE SPONGE 4X4 12PLY STRL (GAUZE/BANDAGES/DRESSINGS) ×3 IMPLANT
GAUZE XEROFORM 1X8 LF (GAUZE/BANDAGES/DRESSINGS) ×3 IMPLANT
GLOVE BIO SURGEON STRL SZ7.5 (GLOVE) ×3 IMPLANT
GLOVE BIOGEL PI IND STRL 7.0 (GLOVE) IMPLANT
GLOVE BIOGEL PI IND STRL 8 (GLOVE) ×2 IMPLANT
GLOVE BIOGEL PI INDICATOR 7.0 (GLOVE) ×2
GLOVE BIOGEL PI INDICATOR 8 (GLOVE) ×1
GLOVE SURG SS PI 7.0 STRL IVOR (GLOVE) ×1 IMPLANT
GOWN STRL REUS W/ TWL LRG LVL3 (GOWN DISPOSABLE) ×2 IMPLANT
GOWN STRL REUS W/TWL LRG LVL3 (GOWN DISPOSABLE) ×3
GOWN STRL REUS W/TWL XL LVL3 (GOWN DISPOSABLE) ×3 IMPLANT
NDL HYPO 25X1 1.5 SAFETY (NEEDLE) ×2 IMPLANT
NEEDLE HYPO 25X1 1.5 SAFETY (NEEDLE) ×3 IMPLANT
NS IRRIG 1000ML POUR BTL (IV SOLUTION) ×3 IMPLANT
PACK BASIN DAY SURGERY FS (CUSTOM PROCEDURE TRAY) ×3 IMPLANT
PADDING CAST ABS 4INX4YD NS (CAST SUPPLIES)
PADDING CAST ABS COTTON 4X4 ST (CAST SUPPLIES) ×2 IMPLANT
STOCKINETTE 4X48 STRL (DRAPES) ×3 IMPLANT
SUT ETHILON 4 0 PS 2 18 (SUTURE) ×3 IMPLANT
SYR BULB EAR ULCER 3OZ GRN STR (SYRINGE) ×3 IMPLANT
SYR CONTROL 10ML LL (SYRINGE) ×3 IMPLANT
TOWEL GREEN STERILE FF (TOWEL DISPOSABLE) ×6 IMPLANT
UNDERPAD 30X36 HEAVY ABSORB (UNDERPADS AND DIAPERS) ×3 IMPLANT

## 2021-12-16 NOTE — H&P (Signed)
?  Sharon Beard is an 66 y.o. female.   ?Chief Complaint: carpal tunnel syndrome ?HPI: 66 yo female with numbness and tingling left hand.  This is bothersome to her.  Positive nerve conduction studies.  She wishes to have left carpal tunnel release. ? ?Allergies: No Known Allergies ? ?Past Medical History:  ?Diagnosis Date  ? Anxiety   ? Depression   ? High cholesterol   ? Hyperlipidemia   ? Hypertension   ? ? ?Past Surgical History:  ?Procedure Laterality Date  ? COLONOSCOPY    ? HERNIA REPAIR    ? ? ?Family History: ?Family History  ?Problem Relation Age of Onset  ? Breast cancer Neg Hx   ? ? ?Social History:  ? reports that she has never smoked. She has never used smokeless tobacco. She reports current alcohol use. She reports that she does not use drugs. ? ?Medications: ?Medications Prior to Admission  ?Medication Sig Dispense Refill  ? atorvastatin (LIPITOR) 40 MG tablet Take 40 mg by mouth daily.    ? ENALAPRIL-HYDROCHLOROTHIAZIDE PO Take 10-25 mg by mouth daily.    ? meloxicam (MOBIC) 7.5 MG tablet Take 7.5 mg by mouth daily.    ? Multiple Vitamin (MULTIVITAMIN) tablet Take 1 tablet by mouth daily.    ? PAROXETINE HCL PO Take 20 mg by mouth daily.    ? ? ?No results found for this or any previous visit (from the past 48 hour(s)). ? ?No results found. ? ? ? ?Blood pressure (!) 184/97, pulse 78, temperature 98.5 ?F (36.9 ?C), temperature source Oral, resp. rate 16, height 5\' 4"  (1.626 m), weight 89.8 kg, SpO2 99 %. ? ?General appearance: alert, cooperative, and appears stated age ?Head: Normocephalic, without obvious abnormality, atraumatic ?Neck: supple, symmetrical, trachea midline ?Extremities: decreased sensation and intact capillary refill all digits.  +epl/fpl/io.  No wounds.  ?Pulses: 2+ and symmetric ?Skin: Skin color, texture, turgor normal. No rashes or lesions ?Neurologic: Grossly normal ?Incision/Wound: none ? ?NCV: 11/30/21: left median nerve sensory latency 4.5, motor latency 5.7.  consistent  with left carpal tunnel syndrome. ? ?Assessment/Plan ?Left carpal tunnel syndrome.  Non operative and operative treatment options have been discussed with the patient and patient wishes to proceed with operative treatment. Risks, benefits and alternatives of surgery were discussed including risks of blood loss, infection, damage to nerves/vessels/tendons/ligament/bone, failure of surgery, need for additional surgery, complication with wound healing, stiffness, recurrence, damage to motor branch.  She voiced understanding of these risks and elected to proceed.   ? ?01/30/22 ?12/16/2021, 1:04 PM ? ?

## 2021-12-16 NOTE — Transfer of Care (Signed)
Immediate Anesthesia Transfer of Care Note ? ?Patient: Sharon Beard ? ?Procedure(s) Performed: CARPAL TUNNEL RELEASE LEFT (Left: Hand) ? ?Patient Location: PACU ? ?Anesthesia Type:MAC and Regional ? ?Level of Consciousness: awake, alert  and oriented ? ?Airway & Oxygen Therapy: Patient Spontanous Breathing ? ?Post-op Assessment: Report given to RN and Post -op Vital signs reviewed and stable ? ?Post vital signs: Reviewed and stable ? ?Last Vitals:  ?Vitals Value Taken Time  ?BP    ?Temp    ?Pulse    ?Resp    ?SpO2    ? ? ?Last Pain:  ?Vitals:  ? 12/16/21 1147  ?TempSrc: Oral  ?PainSc: 0-No pain  ?   ? ?  ? ?Complications: No notable events documented. ?

## 2021-12-16 NOTE — Anesthesia Postprocedure Evaluation (Signed)
Anesthesia Post Note ? ?Patient: Sharon Beard ? ?Procedure(s) Performed: Carpal Tunnel Release Left (Left: Hand) ?Left wrist flexor tenosynovectomy (Left: Wrist) ? ?  ? ?Patient location during evaluation: PACU ?Anesthesia Type: Bier Block ?Level of consciousness: awake and alert ?Pain management: pain level controlled ?Vital Signs Assessment: post-procedure vital signs reviewed and stable ?Respiratory status: spontaneous breathing, nonlabored ventilation, respiratory function stable and patient connected to nasal cannula oxygen ?Cardiovascular status: stable and blood pressure returned to baseline ?Postop Assessment: no apparent nausea or vomiting ?Anesthetic complications: no ? ? ?No notable events documented. ? ?Last Vitals:  ?Vitals:  ? 12/16/21 1500 12/16/21 1512  ?BP: (!) 153/95 (!) 158/99  ?Pulse: 63 65  ?Resp: 12 16  ?Temp:  36.6 ?C  ?SpO2: 97% 94%  ?  ?Last Pain:  ?Vitals:  ? 12/16/21 1512  ?TempSrc:   ?PainSc: 0-No pain  ? ? ?  ?  ?  ?  ?  ?  ? ?Shelton Silvas ? ? ? ? ?

## 2021-12-16 NOTE — Op Note (Addendum)
12/16/2021 ?[facility] ? ?                            OPERATIVE REPORT ? ? ?PREOPERATIVE DIAGNOSIS:  Left carpal tunnel syndrome. ? ?POSTOPERATIVE DIAGNOSIS:  Left carpal tunnel syndrome and flexor tenosynovitis ? ?PROCEDURE:   ?Left carpal tunnel release ?Left wrist flexor tenosynovectomy ? ?SURGEON:  Betha Loa, MD ? ?ASSISTANT:  none. ? ?ANESTHESIA: Bier block with sedation ? ?IV FLUIDS:  Per anesthesia flow sheet. ? ?ESTIMATED BLOOD LOSS:  Minimal. ? ?COMPLICATIONS:  None. ? ?SPECIMENS:  None. ? ?TOURNIQUET TIME:   ? ?Total Tourniquet Time Documented: ?Forearm (Left) - 49 minutes ?Total: Forearm (Left) - 49 minutes ? ? ?DISPOSITION:  Stable to PACU. ? ?LOCATION: [facility] ? ?INDICATIONS:  66 yo female with numbness and tingling left hand.  Nocturnal symptoms.  Positive nerve conduction studies.   She wishes to have a carpal tunnel release for management of her symptoms.  Risks, benefits and alternatives of surgery were discussed including the risk of blood loss; infection; damage to nerves, vessels, tendons, ligaments, bone; failure of surgery; need for additional surgery; complications with wound healing; continued pain; recurrence of carpal tunnel syndrome; and damage to motor branch. She voiced understanding of these risks and elected to proceed.  ? ?OPERATIVE COURSE:  After being identified preoperatively by myself, the patient and I agreed upon the procedure and site of procedure.  The surgical site was marked.  Surgical consent had been signed.  She was given IV Ancef as preoperative antibiotic prophylaxis.  She was transferred to the operating room and placed on the operating room table in supine position with the Left upper extremity on an armboard.  Bier block anesthesia was induced by the anesthesiologist.  Left upper extremity was prepped and draped in normal sterile orthopaedic fashion.  A surgical pause was performed between the surgeons, anesthesia, and operating room  staff, and all were in agreement as to the patient, procedure, and site of procedure.  Tourniquet at the proximal aspect of the forearm had been inflated for the Bier block  Incision was made over the transverse carpal ligament and carried into the subcutaneous tissues by spreading technique.  Bipolar electrocautery was used to obtain hemostasis.  The palmar fascia was sharply incised.  The transverse carpal ligament was identified and sharply incised.  It was incised distally first.  The flexor tendons were identified.  There was expression of synovial type fluid from around the tendons.  Cultures were taken for aerobes and anaerobes.  The flexor tendon to the ring finger was identified and retracted radially.  The transverse carpal ligament was then incised proximally.  Scissors were used to split the distal aspect of the volar antebrachial fascia.  A finger was placed into the wound to ensure complete decompression, which was the case.  There was significant amount of synovium surrounding the flexor tendons.  There was more expression of the synovial type fluid.  In all 10 to 20 cc of fluid was noted.  The median nerve was carefully freed up from this and gently retracted.  The synovium was debrided from the flexor tendons in the area of the carpal tunnel and just proximal to the transverse carpal ligament.  This was done carefully with the scissors and rongeurs while protecting the median nerve.  This was sent to pathology for examination.  There were also 3 loose bodies that were whitish in coloration.  They were soft.  These were also sent to pathology for examination.  The nerve was examined.  It was adherent to the radial leaflet.  The motor branch was identified and was intact.  The wound was copiously irrigated with sterile saline.  It was then closed with 4-0 nylon in a horizontal mattress fashion.  It was injected with 0.25% plain Marcaine to aid in postoperative analgesia.  It was dressed with sterile  Xeroform, 4x4s, an ABD, and wrapped with Kerlix and an Ace bandage.  Tourniquet was deflated at 49 minutes.  Fingertips were pink with brisk capillary refill after deflation of the tourniquet.  Operative drapes were broken down.  The patient was awoken from anesthesia safely.  She was transferred back to stretcher and taken to the PACU in stable condition.  I will see her back in the office in 1 week for postoperative followup.  I will give her a prescription for Norco 5/325 1-2 tabs PO q6 hours prn pain, dispense # 20. ? ? ? ?Betha Loa, MD ?Electronically signed, 12/16/21 ? ?

## 2021-12-16 NOTE — Anesthesia Procedure Notes (Signed)
Anesthesia Regional Block: Bier block (IV Regional)  ? ?Pre-Anesthetic Checklist: , timeout performed,  Correct Patient, Correct Site, Correct Laterality,  Correct Procedure, Correct Position, site marked,  Risks and benefits discussed,  Surgical consent,  Pre-op evaluation,  At surgeon's request ? ?Laterality: Left ? ?Prep: alcohol swabs     ?  ?Needles:  ?Injection technique: Single-shot ? ? ? ? ? ?Additional Needles: ? ? ?Procedures:,,,,, intact distal pulses, Esmarch exsanguination,  Single tourniquet utilized    ?Narrative:  ?Start time: 12/16/2021 1:27 PM ?End time: 12/16/2021 1:28 PM ? ?Performed by: Personally  ? ? ? ?

## 2021-12-16 NOTE — Discharge Instructions (Addendum)
[facility] Instructions ?Hand Surgery ? ?Wound Care: ?Keep your hand elevated above the level of your heart.  Do not allow it to dangle by your side.  Keep the dressing dry and do not remove it unless your doctor advises you to do so.  He will usually change it at the time of your post-op visit.  Moving your fingers is advised to stimulate circulation but will depend on the site of your surgery.  If you have a splint applied, your doctor will advise you regarding movement. ? ?Activity: ?Do not drive or operate machinery today.  Rest today and then you may return to your normal activity and work as indicated by your physician. ? ?Diet:  ?Drink liquids today or eat a light diet.  You may resume a regular diet tomorrow.   ? ?General expectations: ?Pain for two to three days. ?Fingers may become slightly swollen. ? ?Call your doctor if any of the following occur: ?Severe pain not relieved by pain medication. ?Elevated temperature. ?Dressing soaked with blood. ?Inability to move fingers. ?White or bluish color to fingers. ? ? ? ?Post Anesthesia Home Care Instructions ? ?Activity: ?Get plenty of rest for the remainder of the day. A responsible individual must stay with you for 24 hours following the procedure.  ?For the next 24 hours, DO NOT: ?-Drive a car ?-Advertising copywriter ?-Drink alcoholic beverages ?-Take any medication unless instructed by your physician ?-Make any legal decisions or sign important papers. ? ?Meals: ?Start with liquid foods such as gelatin or soup. Progress to regular foods as tolerated. Avoid greasy, spicy, heavy foods. If nausea and/or vomiting occur, drink only clear liquids until the nausea and/or vomiting subsides. Call your physician if vomiting continues. ? ?Special Instructions/Symptoms: ?Your throat may feel dry or sore from the anesthesia or the breathing tube placed in your throat during surgery. If this causes discomfort, gargle with warm salt water. The discomfort should disappear  within 24 hours. ? ?If you had a scopolamine patch placed behind your ear for the management of post- operative nausea and/or vomiting: ? ?1. The medication in the patch is effective for 72 hours, after which it should be removed.  Wrap patch in a tissue and discard in the trash. Wash hands thoroughly with soap and water. ?2. You may remove the patch earlier than 72 hours if you experience unpleasant side effects which may include dry mouth, dizziness or visual disturbances. ?3. Avoid touching the patch. Wash your hands with soap and water after contact with the patch. ? ?No tylenol until after 9pm tonight if needed. ?    ? ?

## 2021-12-16 NOTE — Anesthesia Preprocedure Evaluation (Addendum)
Anesthesia Evaluation  ?Patient identified by MRN, date of birth, ID band ?Patient awake ? ? ? ?Reviewed: ?Allergy & Precautions, NPO status , Patient's Chart, lab work & pertinent test results ? ?Airway ?Mallampati: II ? ?TM Distance: >3 FB ?Neck ROM: Full ? ? ? Dental ? ?(+) Teeth Intact, Dental Advisory Given ?  ?Pulmonary ? ?  ?breath sounds clear to auscultation ? ? ? ? ? ? Cardiovascular ?hypertension, Pt. on medications ? ?Rhythm:Regular Rate:Normal ? ? ?  ?Neuro/Psych ?PSYCHIATRIC DISORDERS Anxiety Depression   ? GI/Hepatic ?negative GI ROS, Neg liver ROS,   ?Endo/Other  ?negative endocrine ROS ? Renal/GU ?negative Renal ROS  ? ?  ?Musculoskeletal ?negative musculoskeletal ROS ?(+)  ? Abdominal ?Normal abdominal exam  (+)   ?Peds ? Hematology ?negative hematology ROS ?(+)   ?Anesthesia Other Findings ? ? Reproductive/Obstetrics ? ?  ? ? ? ? ? ? ? ? ? ? ? ? ? ?  ?  ? ? ? ? ? ? ? ?Anesthesia Physical ?Anesthesia Plan ? ?ASA: 2 ? ?Anesthesia Plan: Pharmacist, community Block-LIDOCAINE ONLY  ? ?Post-op Pain Management:   ? ?Induction:  ? ?PONV Risk Score and Plan: 2 and Ondansetron and Propofol infusion ? ?Airway Management Planned: Natural Airway and Simple Face Mask ? ?Additional Equipment: None ? ?Intra-op Plan:  ? ?Post-operative Plan: Extubation in OR ? ?Informed Consent: I have reviewed the patients History and Physical, chart, labs and discussed the procedure including the risks, benefits and alternatives for the proposed anesthesia with the patient or authorized representative who has indicated his/her understanding and acceptance.  ? ? ? ? ? ?Plan Discussed with: CRNA ? ?Anesthesia Plan Comments:   ? ? ? ? ? ?Anesthesia Quick Evaluation ? ?

## 2021-12-19 LAB — SURGICAL PATHOLOGY

## 2021-12-19 NOTE — Progress Notes (Signed)
Left message stating courtesy call and if any questions or concerns please call the doctors office.  

## 2021-12-21 LAB — AEROBIC/ANAEROBIC CULTURE W GRAM STAIN (SURGICAL/DEEP WOUND)
Culture: NO GROWTH
Gram Stain: NONE SEEN

## 2022-01-10 DIAGNOSIS — R21 Rash and other nonspecific skin eruption: Secondary | ICD-10-CM | POA: Diagnosis not present

## 2022-02-13 DIAGNOSIS — R21 Rash and other nonspecific skin eruption: Secondary | ICD-10-CM | POA: Diagnosis not present

## 2022-02-23 DIAGNOSIS — L929 Granulomatous disorder of the skin and subcutaneous tissue, unspecified: Secondary | ICD-10-CM | POA: Diagnosis not present

## 2022-02-23 DIAGNOSIS — R21 Rash and other nonspecific skin eruption: Secondary | ICD-10-CM | POA: Diagnosis not present

## 2022-03-29 DIAGNOSIS — F1020 Alcohol dependence, uncomplicated: Secondary | ICD-10-CM | POA: Diagnosis not present

## 2022-03-29 DIAGNOSIS — F409 Phobic anxiety disorder, unspecified: Secondary | ICD-10-CM | POA: Diagnosis not present

## 2022-05-11 DIAGNOSIS — F409 Phobic anxiety disorder, unspecified: Secondary | ICD-10-CM | POA: Diagnosis not present

## 2022-05-11 DIAGNOSIS — F1020 Alcohol dependence, uncomplicated: Secondary | ICD-10-CM | POA: Diagnosis not present

## 2022-07-04 DIAGNOSIS — Z0001 Encounter for general adult medical examination with abnormal findings: Secondary | ICD-10-CM | POA: Diagnosis not present

## 2022-07-04 DIAGNOSIS — M25511 Pain in right shoulder: Secondary | ICD-10-CM | POA: Diagnosis not present

## 2022-07-04 DIAGNOSIS — F411 Generalized anxiety disorder: Secondary | ICD-10-CM | POA: Diagnosis not present

## 2022-07-04 DIAGNOSIS — E7800 Pure hypercholesterolemia, unspecified: Secondary | ICD-10-CM | POA: Diagnosis not present

## 2022-07-04 DIAGNOSIS — I10 Essential (primary) hypertension: Secondary | ICD-10-CM | POA: Diagnosis not present

## 2022-09-15 DIAGNOSIS — R0982 Postnasal drip: Secondary | ICD-10-CM | POA: Diagnosis not present

## 2022-09-15 DIAGNOSIS — H6121 Impacted cerumen, right ear: Secondary | ICD-10-CM | POA: Diagnosis not present

## 2022-10-25 DIAGNOSIS — G5601 Carpal tunnel syndrome, right upper limb: Secondary | ICD-10-CM | POA: Diagnosis not present

## 2022-11-06 DIAGNOSIS — F409 Phobic anxiety disorder, unspecified: Secondary | ICD-10-CM | POA: Diagnosis not present

## 2022-11-06 DIAGNOSIS — F1020 Alcohol dependence, uncomplicated: Secondary | ICD-10-CM | POA: Diagnosis not present

## 2022-11-16 DIAGNOSIS — G5601 Carpal tunnel syndrome, right upper limb: Secondary | ICD-10-CM | POA: Diagnosis not present

## 2022-11-17 DIAGNOSIS — G5601 Carpal tunnel syndrome, right upper limb: Secondary | ICD-10-CM | POA: Diagnosis not present

## 2022-11-21 ENCOUNTER — Other Ambulatory Visit: Payer: Self-pay | Admitting: Orthopedic Surgery

## 2022-12-01 ENCOUNTER — Other Ambulatory Visit: Payer: Self-pay

## 2022-12-01 ENCOUNTER — Encounter (HOSPITAL_BASED_OUTPATIENT_CLINIC_OR_DEPARTMENT_OTHER): Payer: Self-pay | Admitting: Orthopedic Surgery

## 2022-12-05 ENCOUNTER — Encounter (HOSPITAL_BASED_OUTPATIENT_CLINIC_OR_DEPARTMENT_OTHER)
Admission: RE | Admit: 2022-12-05 | Discharge: 2022-12-05 | Disposition: A | Discharge status: Home / Self Care | Payer: BC Managed Care – PPO | Source: Ambulatory Visit | Attending: Orthopedic Surgery | Admitting: Orthopedic Surgery

## 2022-12-05 DIAGNOSIS — I10 Essential (primary) hypertension: Secondary | ICD-10-CM | POA: Diagnosis not present

## 2022-12-05 DIAGNOSIS — Z01810 Encounter for preprocedural cardiovascular examination: Secondary | ICD-10-CM | POA: Insufficient documentation

## 2022-12-05 DIAGNOSIS — G5601 Carpal tunnel syndrome, right upper limb: Secondary | ICD-10-CM | POA: Diagnosis not present

## 2022-12-05 LAB — BASIC METABOLIC PANEL
Anion gap: 10 (ref 5–15)
BUN: 20 mg/dL (ref 8–23)
CO2: 26 mmol/L (ref 22–32)
Calcium: 9.5 mg/dL (ref 8.9–10.3)
Chloride: 101 mmol/L (ref 98–111)
Creatinine, Ser: 0.83 mg/dL (ref 0.44–1.00)
GFR, Estimated: >60 mL/min (ref >60)
Glucose, Bld: 99 mg/dL (ref 70–99)
Potassium: 4.1 mmol/L (ref 3.5–5.1)
Sodium: 137 mmol/L (ref 135–145)

## 2022-12-05 NOTE — Progress Notes (Signed)

## 2022-12-08 ENCOUNTER — Ambulatory Visit (HOSPITAL_BASED_OUTPATIENT_CLINIC_OR_DEPARTMENT_OTHER): Payer: BC Managed Care – PPO | Admitting: Certified Registered"

## 2022-12-08 ENCOUNTER — Encounter (HOSPITAL_BASED_OUTPATIENT_CLINIC_OR_DEPARTMENT_OTHER): Payer: Self-pay | Admitting: Orthopedic Surgery

## 2022-12-08 ENCOUNTER — Ambulatory Visit (HOSPITAL_BASED_OUTPATIENT_CLINIC_OR_DEPARTMENT_OTHER)
Admission: RE | Admit: 2022-12-08 | Discharge: 2022-12-08 | Disposition: A | Discharge status: Home / Self Care | Payer: BC Managed Care – PPO | Attending: Orthopedic Surgery | Admitting: Orthopedic Surgery

## 2022-12-08 ENCOUNTER — Encounter (HOSPITAL_BASED_OUTPATIENT_CLINIC_OR_DEPARTMENT_OTHER)
Admission: RE | Disposition: A | Discharge status: Home / Self Care | Payer: Self-pay | Source: Home / Self Care | Attending: Orthopedic Surgery

## 2022-12-08 ENCOUNTER — Other Ambulatory Visit: Payer: Self-pay

## 2022-12-08 DIAGNOSIS — I10 Essential (primary) hypertension: Secondary | ICD-10-CM | POA: Diagnosis not present

## 2022-12-08 DIAGNOSIS — G5601 Carpal tunnel syndrome, right upper limb: Secondary | ICD-10-CM | POA: Insufficient documentation

## 2022-12-08 HISTORY — PX: CARPAL TUNNEL RELEASE: SHX101

## 2022-12-08 SURGERY — CARPAL TUNNEL RELEASE
Anesthesia: General | Site: Wrist | Laterality: Right

## 2022-12-08 MED ORDER — ONDANSETRON HCL 4 MG/2ML IJ SOLN
INTRAMUSCULAR | Status: DC | PRN
  Administered 2022-12-08: 4 mg via INTRAVENOUS

## 2022-12-08 MED ORDER — BUPIVACAINE HCL (PF) 0.25 % IJ SOLN
INTRAMUSCULAR | Status: DC | PRN
  Administered 2022-12-08: 9 mL

## 2022-12-08 MED ORDER — LACTATED RINGERS IV SOLN: INTRAVENOUS | Status: DC

## 2022-12-08 MED ORDER — CEFAZOLIN SODIUM-DEXTROSE 2-4 GM/100ML-% IV SOLN
INTRAVENOUS | Status: AC
  Filled 2022-12-08: qty 100

## 2022-12-08 MED ORDER — FENTANYL CITRATE (PF) 100 MCG/2ML IJ SOLN
INTRAMUSCULAR | Status: AC
  Filled 2022-12-08: qty 2

## 2022-12-08 MED ORDER — DEXAMETHASONE SODIUM PHOSPHATE 10 MG/ML IJ SOLN
INTRAMUSCULAR | Status: DC | PRN
  Administered 2022-12-08: 10 mg via INTRAVENOUS

## 2022-12-08 MED ORDER — HYDROCODONE-ACETAMINOPHEN 5-325 MG PO TABS: ORAL_TABLET | ORAL | 0 refills | Status: AC

## 2022-12-08 MED ORDER — FENTANYL CITRATE (PF) 100 MCG/2ML IJ SOLN: 25.0000 ug | INTRAMUSCULAR | Status: DC | PRN

## 2022-12-08 MED ORDER — LIDOCAINE 2% (20 MG/ML) 5 ML SYRINGE
INTRAMUSCULAR | Status: DC | PRN
  Administered 2022-12-08: 100 mg via INTRAVENOUS

## 2022-12-08 MED ORDER — LIDOCAINE 2% (20 MG/ML) 5 ML SYRINGE
INTRAMUSCULAR | Status: AC
  Filled 2022-12-08: qty 5

## 2022-12-08 MED ORDER — ONDANSETRON HCL 4 MG/2ML IJ SOLN: 4.0000 mg | Freq: Once | INTRAMUSCULAR | Status: DC | PRN

## 2022-12-08 MED ORDER — 0.9 % SODIUM CHLORIDE (POUR BTL) OPTIME
TOPICAL | Status: DC | PRN
  Administered 2022-12-08: 200 mL

## 2022-12-08 MED ORDER — MIDAZOLAM HCL 2 MG/2ML IJ SOLN
INTRAMUSCULAR | Status: AC
  Filled 2022-12-08: qty 2

## 2022-12-08 MED ORDER — OXYCODONE HCL 5 MG PO TABS: 5.0000 mg | ORAL_TABLET | Freq: Once | ORAL | Status: DC | PRN

## 2022-12-08 MED ORDER — MIDAZOLAM HCL 5 MG/5ML IJ SOLN
INTRAMUSCULAR | Status: DC | PRN
  Administered 2022-12-08: 2 mg via INTRAVENOUS

## 2022-12-08 MED ORDER — DEXAMETHASONE SODIUM PHOSPHATE 10 MG/ML IJ SOLN
INTRAMUSCULAR | Status: AC
  Filled 2022-12-08: qty 1

## 2022-12-08 MED ORDER — PROPOFOL 10 MG/ML IV BOLUS
INTRAVENOUS | Status: DC | PRN
  Administered 2022-12-08: 150 mg via INTRAVENOUS

## 2022-12-08 MED ORDER — CEFAZOLIN SODIUM-DEXTROSE 2-4 GM/100ML-% IV SOLN
2.0000 g | INTRAVENOUS | Status: AC
  Administered 2022-12-08: 2 g via INTRAVENOUS

## 2022-12-08 MED ORDER — ONDANSETRON HCL 4 MG/2ML IJ SOLN
INTRAMUSCULAR | Status: AC
  Filled 2022-12-08: qty 2

## 2022-12-08 MED ORDER — OXYCODONE HCL 5 MG/5ML PO SOLN: 5.0000 mg | Freq: Once | ORAL | Status: DC | PRN

## 2022-12-08 MED ORDER — FENTANYL CITRATE (PF) 100 MCG/2ML IJ SOLN
INTRAMUSCULAR | Status: DC | PRN
  Administered 2022-12-08 (×4): 25 ug via INTRAVENOUS

## 2022-12-08 SURGICAL SUPPLY — 35 items
APL PRP STRL LF DISP 70% ISPRP (MISCELLANEOUS) ×1
BLADE SURG 15 STRL LF DISP TIS (BLADE) ×2 IMPLANT
BLADE SURG 15 STRL SS (BLADE) ×2
BNDG CMPR 5X3 KNIT ELC UNQ LF (GAUZE/BANDAGES/DRESSINGS) ×1
BNDG CMPR 9X4 STRL LF SNTH (GAUZE/BANDAGES/DRESSINGS)
BNDG ELASTIC 3INX 5YD STR LF (GAUZE/BANDAGES/DRESSINGS) ×1 IMPLANT
BNDG ESMARK 4X9 LF (GAUZE/BANDAGES/DRESSINGS) IMPLANT
BNDG GAUZE DERMACEA FLUFF 4 (GAUZE/BANDAGES/DRESSINGS) ×1 IMPLANT
BNDG GZE DERMACEA 4 6PLY (GAUZE/BANDAGES/DRESSINGS) ×1
CHLORAPREP W/TINT 26 (MISCELLANEOUS) ×1 IMPLANT
CORD BIPOLAR FORCEPS 12FT (ELECTRODE) ×1 IMPLANT
COVER BACK TABLE 60X90IN (DRAPES) ×1 IMPLANT
COVER MAYO STAND STRL (DRAPES) ×1 IMPLANT
CUFF TOURN SGL QUICK 18X4 (TOURNIQUET CUFF) ×1 IMPLANT
DRAPE EXTREMITY T 121X128X90 (DISPOSABLE) ×1 IMPLANT
DRAPE SURG 17X23 STRL (DRAPES) ×1 IMPLANT
GAUZE PAD ABD 8X10 STRL (GAUZE/BANDAGES/DRESSINGS) ×1 IMPLANT
GAUZE SPONGE 4X4 12PLY STRL (GAUZE/BANDAGES/DRESSINGS) ×1 IMPLANT
GAUZE XEROFORM 1X8 LF (GAUZE/BANDAGES/DRESSINGS) ×1 IMPLANT
GLOVE BIO SURGEON STRL SZ7.5 (GLOVE) ×1 IMPLANT
GLOVE BIOGEL PI IND STRL 8 (GLOVE) ×1 IMPLANT
GOWN STRL REUS W/ TWL LRG LVL3 (GOWN DISPOSABLE) ×1 IMPLANT
GOWN STRL REUS W/TWL LRG LVL3 (GOWN DISPOSABLE) ×1
GOWN STRL REUS W/TWL XL LVL3 (GOWN DISPOSABLE) ×1 IMPLANT
NDL HYPO 25X1 1.5 SAFETY (NEEDLE) ×1 IMPLANT
NEEDLE HYPO 25X1 1.5 SAFETY (NEEDLE) ×1 IMPLANT
NS IRRIG 1000ML POUR BTL (IV SOLUTION) ×1 IMPLANT
PACK BASIN DAY SURGERY FS (CUSTOM PROCEDURE TRAY) ×1 IMPLANT
PADDING CAST ABS COTTON 4X4 ST (CAST SUPPLIES) ×1 IMPLANT
STOCKINETTE 4X48 STRL (DRAPES) ×1 IMPLANT
SUT ETHILON 4 0 PS 2 18 (SUTURE) ×1 IMPLANT
SYR BULB EAR ULCER 3OZ GRN STR (SYRINGE) ×1 IMPLANT
SYR CONTROL 10ML LL (SYRINGE) ×1 IMPLANT
TOWEL GREEN STERILE FF (TOWEL DISPOSABLE) ×2 IMPLANT
UNDERPAD 30X36 HEAVY ABSORB (UNDERPADS AND DIAPERS) ×1 IMPLANT

## 2022-12-08 NOTE — Anesthesia Preprocedure Evaluation (Signed)
Anesthesia Evaluation  Patient identified by MRN, date of birth, ID band Patient awake    Reviewed: Allergy & Precautions, H&P , NPO status , Patient's Chart, lab work & pertinent test results  Airway Mallampati: II  TM Distance: >3 FB Neck ROM: Full    Dental no notable dental hx.    Pulmonary neg pulmonary ROS   Pulmonary exam normal breath sounds clear to auscultation       Cardiovascular hypertension, Normal cardiovascular exam Rhythm:Regular Rate:Normal     Neuro/Psych negative neurological ROS  negative psych ROS   GI/Hepatic negative GI ROS, Neg liver ROS,,,  Endo/Other  negative endocrine ROS    Renal/GU negative Renal ROS  negative genitourinary   Musculoskeletal negative musculoskeletal ROS (+)    Abdominal   Peds negative pediatric ROS (+)  Hematology negative hematology ROS (+)   Anesthesia Other Findings   Reproductive/Obstetrics negative OB ROS                             Anesthesia Physical Anesthesia Plan  ASA: 2  Anesthesia Plan: General   Post-op Pain Management: Minimal or no pain anticipated   Induction: Intravenous  PONV Risk Score and Plan: 3 and Ondansetron, Dexamethasone and Treatment may vary due to age or medical condition  Airway Management Planned: LMA  Additional Equipment:   Intra-op Plan:   Post-operative Plan: Extubation in OR  Informed Consent: I have reviewed the patients History and Physical, chart, labs and discussed the procedure including the risks, benefits and alternatives for the proposed anesthesia with the patient or authorized representative who has indicated his/her understanding and acceptance.     Dental advisory given  Plan Discussed with: CRNA and Surgeon  Anesthesia Plan Comments:        Anesthesia Quick Evaluation

## 2022-12-08 NOTE — Transfer of Care (Signed)
Immediate Anesthesia Transfer of Care Note  Patient: Sharon Beard  Procedure(s) Performed: RIGHT CARPAL TUNNEL RELEASE (Right: Wrist)  Patient Location: PACU  Anesthesia Type:General  Level of Consciousness: awake, alert , and oriented  Airway & Oxygen Therapy: Patient Spontanous Breathing and Patient connected to face mask oxygen  Post-op Assessment: Report given to RN and Post -op Vital signs reviewed and stable  Post vital signs: Reviewed and stable  Last Vitals:  Vitals Value Taken Time  BP 181/114 12/08/22 1425  Temp    Pulse 81 12/08/22 1427  Resp 17 12/08/22 1427  SpO2 95 % 12/08/22 1427  Vitals shown include unvalidated device data.  Last Pain:  Vitals:   12/08/22 1201  TempSrc: Temporal  PainSc: 3       Patients Stated Pain Goal: 3 (12/08/22 1201)  Complications: No notable events documented.

## 2022-12-08 NOTE — Anesthesia Postprocedure Evaluation (Signed)
Anesthesia Post Note  Patient: Sharon Beard  Procedure(s) Performed: RIGHT CARPAL TUNNEL RELEASE (Right: Wrist)     Patient location during evaluation: PACU Anesthesia Type: General Level of consciousness: awake and alert Pain management: pain level controlled Vital Signs Assessment: post-procedure vital signs reviewed and stable Respiratory status: spontaneous breathing, nonlabored ventilation, respiratory function stable and patient connected to nasal cannula oxygen Cardiovascular status: blood pressure returned to baseline and stable Postop Assessment: no apparent nausea or vomiting Anesthetic complications: no  No notable events documented.  Last Vitals:  Vitals:   12/08/22 1445 12/08/22 1500  BP: (!) 162/94 (!) 170/97  Pulse: 75 75  Resp: 14 16  Temp:  36.7 C  SpO2: 96% 98%    Last Pain:  Vitals:   12/08/22 1500  TempSrc:   PainSc: 0-No pain                 Amare Kontos S

## 2022-12-08 NOTE — Op Note (Addendum)
12/08/2022 [facility]                              OPERATIVE REPORT   PREOPERATIVE DIAGNOSIS:  Right carpal tunnel syndrome.  POSTOPERATIVE DIAGNOSIS:  Right carpal tunnel syndrome.  PROCEDURE:  Right carpal tunnel release.  SURGEON:  Betha Loa, MD  ASSISTANT:  none.  ANESTHESIA: General  IV FLUIDS:  Per anesthesia flow sheet.  ESTIMATED BLOOD LOSS:  Minimal.  COMPLICATIONS:  None.  SPECIMENS:  None.  TOURNIQUET TIME:    Total Tourniquet Time Documented: Upper Arm (Right) - 14 minutes Total: Upper Arm (Right) - 14 minutes   DISPOSITION:  Stable to PACU.  LOCATION: [facility]  INDICATIONS:  67 y.o. yo female with numbness and tingling right hand.  Nocturnal symptoms. Positive nerve conduction studies. She wishes to proceed with right carpal tunnel release.  Risks, benefits and alternatives of surgery were discussed including the risk of blood loss; infection; damage to nerves, vessels, tendons, ligaments, bone; failure of surgery; need for additional surgery; complications with wound healing; continued pain; recurrence of carpal tunnel syndrome; and damage to motor branch. She voiced understanding of these risks and elected to proceed.   OPERATIVE COURSE:  After being identified preoperatively by myself, the patient and I agreed upon the procedure and site of procedure.  The surgical site was marked.  Surgical consent had been signed.  She was given IV Ancef as preoperative antibiotic prophylaxis.  She was transferred to the operating room and placed on the operating room table in supine position with the Right upper extremity on an armboard.  General anesthesia was induced by the anesthesiologist.  Right upper extremity was prepped and draped in normal sterile orthopaedic fashion.  A surgical pause was performed between the surgeons, anesthesia, and operating room staff, and all were in agreement as to the patient, procedure, and site of  procedure.  Tourniquet at the proximal aspect of the extremity was inflated to 250 mmHg after exsanguination of the arm with an Esmarch bandage  Incision was made over the transverse carpal ligament and carried into the subcutaneous tissues by spreading technique.  Bipolar electrocautery was used to obtain hemostasis.  The palmar fascia was sharply incised.  The transverse carpal ligament was identified.  The fascia distal to the ligament was opened.  Retractor was placed and the flexor tendons were identified.  The flexor tendon to the long finger was identified.  The common digital nerve to the long/ring finger was visualized and protected.  The transverse carpal ligament was then incised from distal to proximal under direct visualization.  Scissors were used to split the distal aspect of the volar antebrachial fascia.  A finger was placed into the wound to ensure complete decompression, which was the case.  The nerve was examined.  It was flattened and hyperemic.  The motor branch was identified and was intact.  It was transligamentous distally.  The ligament was released to decompress the motor branch as well.  The wound was copiously irrigated with sterile saline.  It was then closed with 4-0 nylon in a horizontal mattress fashion.  It was injected with 0.25% plain Marcaine to aid in postoperative analgesia.  It was dressed with sterile Xeroform, 4x4s, an ABD, and wrapped with Kerlix and an Ace bandage.  Tourniquet was deflated at 14 minutes.  Fingertips were pink with brisk capillary refill after deflation of the tourniquet.  Operative drapes  were broken down.  The patient was awoken from anesthesia safely.  She was transferred back to stretcher and taken to the PACU in stable condition.  I will see her back in the office in 1 week for postoperative followup.  I will give her a prescription for Norco 5/325 1-2 tabs PO q6 hours prn pain, dispense # 15.    Betha Loa, MD Electronically signed, 12/08/22

## 2022-12-08 NOTE — Discharge Instructions (Addendum)

## 2022-12-08 NOTE — Anesthesia Procedure Notes (Signed)
Procedure Name: LMA Insertion Date/Time: 12/08/2022 1:54 PM  Performed by: Pearson Grippe, CRNAPre-anesthesia Checklist: Patient identified, Emergency Drugs available, Suction available and Patient being monitored Patient Re-evaluated:Patient Re-evaluated prior to induction Oxygen Delivery Method: Circle system utilized Preoxygenation: Pre-oxygenation with 100% oxygen Induction Type: IV induction Ventilation: Mask ventilation without difficulty LMA: LMA inserted LMA Size: 4.0 Number of attempts: 1 Airway Equipment and Method: Bite block Placement Confirmation: positive ETCO2 Tube secured with: Tape Dental Injury: Teeth and Oropharynx as per pre-operative assessment

## 2022-12-08 NOTE — H&P (Signed)
  Sharon Beard is an 67 y.o. female.   Chief Complaint: carpal tunnel syndrome HPI: 67 y.o. yo female with numbness and tingling right hand.  Nocturnal symptoms. Positive nerve conduction studies. She wishes to have right carpal tunnel release.   Allergies: No Known Allergies  Past Medical History:  Diagnosis Date   Anxiety    Depression    High cholesterol    Hyperlipidemia    Hypertension     Past Surgical History:  Procedure Laterality Date   CARPAL TUNNEL RELEASE Left 12/16/2021   Procedure: Carpal Tunnel Release Left;  Surgeon: Betha Loa, MD;  Location: [facility];  Service: Orthopedics;  Laterality: Left;   COLONOSCOPY     HERNIA REPAIR     TENOSYNOVECTOMY Left 12/16/2021   Procedure: Left wrist flexor tenosynovectomy;  Surgeon: Betha Loa, MD;  Location: [facility];  Service: Orthopedics;  Laterality: Left;    Family History: Family History  Problem Relation Age of Onset   Breast cancer Neg Hx     Social History:   reports that she has never smoked. She has never used smokeless tobacco. She reports current alcohol use. She reports that she does not use drugs.  Medications: Medications Prior to Admission  Medication Sig Dispense Refill   atorvastatin (LIPITOR) 40 MG tablet Take 40 mg by mouth daily.     ENALAPRIL-HYDROCHLOROTHIAZIDE PO Take 10-25 mg by mouth daily.     gabapentin (NEURONTIN) 300 MG capsule Take 300 mg by mouth 3 (three) times daily.     meloxicam (MOBIC) 7.5 MG tablet Take 7.5 mg by mouth daily.     Multiple Vitamin (MULTIVITAMIN) tablet Take 1 tablet by mouth daily.     PAROXETINE HCL PO Take 20 mg by mouth daily.     HYDROcodone-acetaminophen (NORCO) 5-325 MG tablet 1-2 tabs po q6 hours prn pain 20 tablet 0    No results found for this or any previous visit (from the past 48 hour(s)).  No results found.    Blood pressure (!) 154/98, pulse 78, temperature 98.1 F (36.7 C), temperature source Temporal,  resp. rate 16, height  (1.626 m), weight 90.9 kg, SpO2 99 %.  General appearance: alert, cooperative, and appears stated age Head: Normocephalic, without obvious abnormality, atraumatic Neck: supple, symmetrical, trachea midline Extremities: Intact capillary refill all digits.  +epl/fpl/io.  No wounds.  Pulses: 2+ and symmetric Skin: Skin color, texture, turgor normal. No rashes or lesions Neurologic: Grossly normal Incision/Wound: none  Assessment/Plan Right carpal tunnel syndrome.  Non operative and operative treatment options have been discussed with the patient and patient wishes to proceed with operative treatment. Risks, benefits, and alternatives of surgery have been discussed and the patient agrees with the plan of care.   Betha Loa 12/08/2022, 12:49 PM

## 2022-12-11 ENCOUNTER — Encounter (HOSPITAL_BASED_OUTPATIENT_CLINIC_OR_DEPARTMENT_OTHER): Payer: Self-pay | Admitting: Orthopedic Surgery

## 2022-12-15 DIAGNOSIS — G5601 Carpal tunnel syndrome, right upper limb: Secondary | ICD-10-CM | POA: Diagnosis not present

## 2022-12-21 DIAGNOSIS — F409 Phobic anxiety disorder, unspecified: Secondary | ICD-10-CM | POA: Diagnosis not present

## 2022-12-21 DIAGNOSIS — F1020 Alcohol dependence, uncomplicated: Secondary | ICD-10-CM | POA: Diagnosis not present

## 2022-12-22 DIAGNOSIS — G5601 Carpal tunnel syndrome, right upper limb: Secondary | ICD-10-CM | POA: Diagnosis not present

## 2022-12-26 DIAGNOSIS — Z789 Other specified health status: Secondary | ICD-10-CM | POA: Diagnosis not present

## 2022-12-26 DIAGNOSIS — G5601 Carpal tunnel syndrome, right upper limb: Secondary | ICD-10-CM | POA: Diagnosis not present

## 2023-01-02 DIAGNOSIS — J309 Allergic rhinitis, unspecified: Secondary | ICD-10-CM | POA: Diagnosis not present

## 2023-01-02 DIAGNOSIS — F411 Generalized anxiety disorder: Secondary | ICD-10-CM | POA: Diagnosis not present

## 2023-01-02 DIAGNOSIS — G5601 Carpal tunnel syndrome, right upper limb: Secondary | ICD-10-CM | POA: Diagnosis not present

## 2023-01-02 DIAGNOSIS — E7800 Pure hypercholesterolemia, unspecified: Secondary | ICD-10-CM | POA: Diagnosis not present

## 2023-01-02 DIAGNOSIS — I10 Essential (primary) hypertension: Secondary | ICD-10-CM | POA: Diagnosis not present

## 2023-01-02 DIAGNOSIS — Z789 Other specified health status: Secondary | ICD-10-CM | POA: Diagnosis not present

## 2023-01-03 DIAGNOSIS — G5601 Carpal tunnel syndrome, right upper limb: Secondary | ICD-10-CM | POA: Diagnosis not present

## 2023-04-18 DIAGNOSIS — F409 Phobic anxiety disorder, unspecified: Secondary | ICD-10-CM | POA: Diagnosis not present

## 2023-04-18 DIAGNOSIS — F1020 Alcohol dependence, uncomplicated: Secondary | ICD-10-CM | POA: Diagnosis not present

## 2023-05-17 DIAGNOSIS — F409 Phobic anxiety disorder, unspecified: Secondary | ICD-10-CM | POA: Diagnosis not present

## 2023-05-17 DIAGNOSIS — F1020 Alcohol dependence, uncomplicated: Secondary | ICD-10-CM | POA: Diagnosis not present

## 2023-07-04 DIAGNOSIS — F409 Phobic anxiety disorder, unspecified: Secondary | ICD-10-CM | POA: Diagnosis not present

## 2023-07-04 DIAGNOSIS — F1020 Alcohol dependence, uncomplicated: Secondary | ICD-10-CM | POA: Diagnosis not present

## 2023-07-06 DIAGNOSIS — K219 Gastro-esophageal reflux disease without esophagitis: Secondary | ICD-10-CM | POA: Diagnosis not present

## 2023-07-06 DIAGNOSIS — I10 Essential (primary) hypertension: Secondary | ICD-10-CM | POA: Diagnosis not present

## 2023-07-06 DIAGNOSIS — Z0001 Encounter for general adult medical examination with abnormal findings: Secondary | ICD-10-CM | POA: Diagnosis not present

## 2023-07-06 DIAGNOSIS — E7800 Pure hypercholesterolemia, unspecified: Secondary | ICD-10-CM | POA: Diagnosis not present

## 2023-07-06 DIAGNOSIS — F411 Generalized anxiety disorder: Secondary | ICD-10-CM | POA: Diagnosis not present

## 2023-08-29 DIAGNOSIS — F1020 Alcohol dependence, uncomplicated: Secondary | ICD-10-CM | POA: Diagnosis not present

## 2023-08-29 DIAGNOSIS — F409 Phobic anxiety disorder, unspecified: Secondary | ICD-10-CM | POA: Diagnosis not present

## 2023-08-30 ENCOUNTER — Other Ambulatory Visit: Payer: Self-pay | Admitting: Internal Medicine

## 2023-08-30 DIAGNOSIS — Z1231 Encounter for screening mammogram for malignant neoplasm of breast: Secondary | ICD-10-CM

## 2023-09-13 ENCOUNTER — Ambulatory Visit
Admission: RE | Admit: 2023-09-13 | Discharge: 2023-09-13 | Disposition: A | Discharge status: Home / Self Care | Payer: BC Managed Care – PPO | Source: Ambulatory Visit | Attending: Internal Medicine | Admitting: Internal Medicine

## 2023-09-13 DIAGNOSIS — Z1231 Encounter for screening mammogram for malignant neoplasm of breast: Secondary | ICD-10-CM

## 2023-10-10 DIAGNOSIS — F409 Phobic anxiety disorder, unspecified: Secondary | ICD-10-CM | POA: Diagnosis not present

## 2023-10-10 DIAGNOSIS — F1020 Alcohol dependence, uncomplicated: Secondary | ICD-10-CM | POA: Diagnosis not present

## 2023-11-12 DIAGNOSIS — F409 Phobic anxiety disorder, unspecified: Secondary | ICD-10-CM | POA: Diagnosis not present

## 2023-11-12 DIAGNOSIS — F1020 Alcohol dependence, uncomplicated: Secondary | ICD-10-CM | POA: Diagnosis not present

## 2023-11-21 DIAGNOSIS — J0100 Acute maxillary sinusitis, unspecified: Secondary | ICD-10-CM | POA: Diagnosis not present

## 2024-01-02 DIAGNOSIS — I10 Essential (primary) hypertension: Secondary | ICD-10-CM | POA: Diagnosis not present

## 2024-01-02 DIAGNOSIS — Z789 Other specified health status: Secondary | ICD-10-CM | POA: Diagnosis not present

## 2024-01-02 DIAGNOSIS — R251 Tremor, unspecified: Secondary | ICD-10-CM | POA: Diagnosis not present

## 2024-01-02 DIAGNOSIS — E7800 Pure hypercholesterolemia, unspecified: Secondary | ICD-10-CM | POA: Diagnosis not present

## 2024-01-02 DIAGNOSIS — F411 Generalized anxiety disorder: Secondary | ICD-10-CM | POA: Diagnosis not present

## 2024-03-07 ENCOUNTER — Other Ambulatory Visit: Payer: Self-pay | Admitting: Medical Genetics

## 2024-03-14 ENCOUNTER — Other Ambulatory Visit (HOSPITAL_COMMUNITY)
Admission: RE | Admit: 2024-03-14 | Discharge: 2024-03-14 | Disposition: A | Discharge status: Home / Self Care | Payer: Self-pay | Source: Ambulatory Visit | Attending: Medical Genetics | Admitting: Medical Genetics

## 2024-03-22 DIAGNOSIS — N390 Urinary tract infection, site not specified: Secondary | ICD-10-CM | POA: Diagnosis not present

## 2024-03-22 DIAGNOSIS — R300 Dysuria: Secondary | ICD-10-CM | POA: Diagnosis not present

## 2024-03-27 DIAGNOSIS — I10 Essential (primary) hypertension: Secondary | ICD-10-CM | POA: Diagnosis not present

## 2024-03-27 DIAGNOSIS — R197 Diarrhea, unspecified: Secondary | ICD-10-CM | POA: Diagnosis not present

## 2024-03-27 DIAGNOSIS — R1030 Lower abdominal pain, unspecified: Secondary | ICD-10-CM | POA: Diagnosis not present

## 2024-03-27 DIAGNOSIS — R748 Abnormal levels of other serum enzymes: Secondary | ICD-10-CM | POA: Diagnosis not present

## 2024-03-27 DIAGNOSIS — E7800 Pure hypercholesterolemia, unspecified: Secondary | ICD-10-CM | POA: Diagnosis not present

## 2024-03-27 DIAGNOSIS — R109 Unspecified abdominal pain: Secondary | ICD-10-CM | POA: Diagnosis not present

## 2024-04-03 LAB — GENECONNECT MOLECULAR SCREEN: Genetic Analysis Overall Interpretation: NEGATIVE

## 2024-04-30 DIAGNOSIS — F1010 Alcohol abuse, uncomplicated: Secondary | ICD-10-CM | POA: Diagnosis not present

## 2024-05-26 DIAGNOSIS — F1010 Alcohol abuse, uncomplicated: Secondary | ICD-10-CM | POA: Diagnosis not present

## 2024-06-05 DIAGNOSIS — F1020 Alcohol dependence, uncomplicated: Secondary | ICD-10-CM | POA: Diagnosis not present

## 2024-07-10 DIAGNOSIS — M8589 Other specified disorders of bone density and structure, multiple sites: Secondary | ICD-10-CM | POA: Diagnosis not present

## 2024-07-10 DIAGNOSIS — E559 Vitamin D deficiency, unspecified: Secondary | ICD-10-CM | POA: Diagnosis not present

## 2024-07-10 DIAGNOSIS — E7800 Pure hypercholesterolemia, unspecified: Secondary | ICD-10-CM | POA: Diagnosis not present

## 2024-07-10 DIAGNOSIS — R251 Tremor, unspecified: Secondary | ICD-10-CM | POA: Diagnosis not present

## 2024-07-10 DIAGNOSIS — F411 Generalized anxiety disorder: Secondary | ICD-10-CM | POA: Diagnosis not present

## 2024-07-10 DIAGNOSIS — Z0001 Encounter for general adult medical examination with abnormal findings: Secondary | ICD-10-CM | POA: Diagnosis not present

## 2024-07-10 DIAGNOSIS — I10 Essential (primary) hypertension: Secondary | ICD-10-CM | POA: Diagnosis not present

## 2024-07-10 DIAGNOSIS — N1831 Chronic kidney disease, stage 3a: Secondary | ICD-10-CM | POA: Diagnosis not present

## 2024-07-10 DIAGNOSIS — Z789 Other specified health status: Secondary | ICD-10-CM | POA: Diagnosis not present

## 2024-07-21 DIAGNOSIS — F1010 Alcohol abuse, uncomplicated: Secondary | ICD-10-CM | POA: Diagnosis not present

## 2024-07-21 DIAGNOSIS — F1090 Alcohol use, unspecified, uncomplicated: Secondary | ICD-10-CM | POA: Diagnosis not present

## 2024-08-07 DIAGNOSIS — F1020 Alcohol dependence, uncomplicated: Secondary | ICD-10-CM | POA: Diagnosis not present
